# Patient Record
Sex: Female | Born: 1985 | Race: White | Hispanic: No | Marital: Single | Smoking: Current every day smoker
Health system: Southern US, Community
[De-identification: ages and names within clinical notes are randomized; demographics above are authoritative.]

## PROBLEM LIST (undated history)

## (undated) DIAGNOSIS — F988 Other specified behavioral and emotional disorders with onset usually occurring in childhood and adolescence: Secondary | ICD-10-CM

## (undated) DIAGNOSIS — N83209 Unspecified ovarian cyst, unspecified side: Secondary | ICD-10-CM

## (undated) DIAGNOSIS — K589 Irritable bowel syndrome without diarrhea: Secondary | ICD-10-CM

## (undated) DIAGNOSIS — G950 Syringomyelia and syringobulbia: Secondary | ICD-10-CM

## (undated) DIAGNOSIS — I499 Cardiac arrhythmia, unspecified: Secondary | ICD-10-CM

## (undated) DIAGNOSIS — E538 Deficiency of other specified B group vitamins: Secondary | ICD-10-CM

## (undated) DIAGNOSIS — K449 Diaphragmatic hernia without obstruction or gangrene: Secondary | ICD-10-CM

## (undated) DIAGNOSIS — K219 Gastro-esophageal reflux disease without esophagitis: Secondary | ICD-10-CM

## (undated) HISTORY — PX: TOOTH EXTRACTION: SUR596

## (undated) HISTORY — DX: Deficiency of other specified B group vitamins: E53.8

## (undated) HISTORY — DX: Unspecified ovarian cyst, unspecified side: N83.209

## (undated) HISTORY — DX: Diaphragmatic hernia without obstruction or gangrene: K44.9

## (undated) HISTORY — DX: Syringomyelia and syringobulbia: G95.0

## (undated) HISTORY — DX: Gastro-esophageal reflux disease without esophagitis: K21.9

## (undated) HISTORY — DX: Irritable bowel syndrome, unspecified: K58.9

## (undated) HISTORY — DX: Other specified behavioral and emotional disorders with onset usually occurring in childhood and adolescence: F98.8

---
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
## 2003-05-05 ENCOUNTER — Encounter: Admission: RE | Admit: 2003-05-05 | Discharge: 2003-05-05 | Payer: Self-pay | Admitting: Family Medicine

## 2003-10-28 ENCOUNTER — Ambulatory Visit (HOSPITAL_COMMUNITY): Admission: RE | Admit: 2003-10-28 | Discharge: 2003-10-28 | Payer: Self-pay | Admitting: Internal Medicine

## 2004-01-06 ENCOUNTER — Encounter (HOSPITAL_COMMUNITY): Admission: RE | Admit: 2004-01-06 | Discharge: 2004-01-06 | Payer: Self-pay | Admitting: Family Medicine

## 2004-01-12 ENCOUNTER — Ambulatory Visit (HOSPITAL_COMMUNITY): Admission: RE | Admit: 2004-01-12 | Discharge: 2004-01-12 | Payer: Self-pay | Admitting: Family Medicine

## 2005-05-04 ENCOUNTER — Ambulatory Visit (HOSPITAL_COMMUNITY): Admission: RE | Admit: 2005-05-04 | Discharge: 2005-05-04 | Payer: Self-pay | Admitting: Family Medicine

## 2007-03-12 ENCOUNTER — Ambulatory Visit (HOSPITAL_COMMUNITY): Admission: RE | Admit: 2007-03-12 | Discharge: 2007-03-12 | Payer: Self-pay | Admitting: Family Medicine

## 2007-03-21 ENCOUNTER — Emergency Department (HOSPITAL_COMMUNITY): Admission: EM | Admit: 2007-03-21 | Discharge: 2007-03-21 | Payer: Self-pay | Admitting: Emergency Medicine

## 2007-03-22 ENCOUNTER — Ambulatory Visit (HOSPITAL_COMMUNITY): Admission: RE | Admit: 2007-03-22 | Discharge: 2007-03-22 | Payer: Self-pay | Admitting: Emergency Medicine

## 2007-03-28 ENCOUNTER — Ambulatory Visit: Payer: Self-pay | Admitting: Gastroenterology

## 2007-04-09 ENCOUNTER — Ambulatory Visit: Payer: Self-pay | Admitting: Internal Medicine

## 2007-04-09 ENCOUNTER — Ambulatory Visit (HOSPITAL_COMMUNITY): Admission: RE | Admit: 2007-04-09 | Discharge: 2007-04-09 | Payer: Self-pay | Admitting: Internal Medicine

## 2007-04-09 HISTORY — PX: COLONOSCOPY: SHX174

## 2007-08-02 ENCOUNTER — Ambulatory Visit (HOSPITAL_COMMUNITY): Admission: RE | Admit: 2007-08-02 | Discharge: 2007-08-02 | Payer: Self-pay | Admitting: Family Medicine

## 2009-02-24 ENCOUNTER — Ambulatory Visit (HOSPITAL_COMMUNITY): Admission: RE | Admit: 2009-02-24 | Discharge: 2009-02-24 | Payer: Self-pay | Admitting: Family Medicine

## 2009-03-18 ENCOUNTER — Ambulatory Visit (HOSPITAL_COMMUNITY): Admission: RE | Admit: 2009-03-18 | Discharge: 2009-03-18 | Payer: Self-pay | Admitting: Family Medicine

## 2009-03-31 DIAGNOSIS — K589 Irritable bowel syndrome without diarrhea: Secondary | ICD-10-CM | POA: Insufficient documentation

## 2009-03-31 DIAGNOSIS — G43909 Migraine, unspecified, not intractable, without status migrainosus: Secondary | ICD-10-CM | POA: Insufficient documentation

## 2009-03-31 DIAGNOSIS — B029 Zoster without complications: Secondary | ICD-10-CM | POA: Insufficient documentation

## 2009-04-13 ENCOUNTER — Ambulatory Visit: Payer: Self-pay | Admitting: Cardiology

## 2009-04-13 ENCOUNTER — Ambulatory Visit (HOSPITAL_COMMUNITY): Admission: RE | Admit: 2009-04-13 | Discharge: 2009-04-13 | Payer: Self-pay | Admitting: Pulmonary Disease

## 2009-04-13 ENCOUNTER — Encounter (INDEPENDENT_AMBULATORY_CARE_PROVIDER_SITE_OTHER): Payer: Self-pay | Admitting: Pulmonary Disease

## 2009-05-14 ENCOUNTER — Ambulatory Visit: Payer: Self-pay | Admitting: Internal Medicine

## 2009-05-17 DIAGNOSIS — K5900 Constipation, unspecified: Secondary | ICD-10-CM | POA: Insufficient documentation

## 2010-02-21 HISTORY — PX: ESOPHAGOGASTRODUODENOSCOPY: SHX1529

## 2010-03-23 NOTE — Assessment & Plan Note (Signed)
Summary: follow up - cdg   Visit Type:  Follow-up Visit Primary Care Provider:  cresenzo  Chief Complaint:  follow up- having problems with bloating and sob.  History of Present Illness: 25 year old lady with chronic abdominal pain abdominal bloating and worsening of constipation. This lady has been extensively evaluated through this office previously. Prior workup included upper GI  / small bowel follow-through and ngative ileocolonoscopy 2009 (Mother with a history colonic polyps at 64). Does not get the urge to have a bowel movement; may go once weekly has chronic abdominal bloating does not have diarrhea; not having any blood per rectum. She has gained 18 pounds since  February 2009. TSH normal at 1.73 -  March 2009. She was on MiraLax. She did not feel that this helped. She did not followup with [place] previously. In fact, she was a no show on 3.18. 2009.  More recently, she has had problems with dyspnea and dyspnea on exertion. She's been thoroughly evaluated by Dr. Carey Bullocks and [place]. Apparently no significant findings from their evaluation.  Preventive Screening-Counseling & Management  Alcohol-Tobacco     Smoking Status: quit  Current Problems (verified): 1)  Herpes Zoster  (ICD-053.9) 2)  Migraine Headache  (ICD-346.90) 3)  Hx of Irritable Bowel Syndrome  (ICD-564.1)  Current Medications (verified): 1)  Birth Control .... Once Daily  Allergies (verified): 1)  ! Penicillin 2)  ! Imitrex  Past History:  Past Surgical History: none  Family History: Father: alive- htn Mother: alive- fibromialgia Siblings: 1 brother No FH of Colon Cancer:  Social History: Marital Status: no Children: no Occupation: yes, [employer] Patient is a former smoker.  Alcohol Use - no Smoking Status:  quit  Vital Signs:  Patient profile:   25 year old female Height:      65.5 inches Weight:      149 pounds BMI:     24.51 Temp:     98.2 degrees F oral Pulse rate:    60 / minute BP sitting:   122 / 88  (left arm) Cuff size:   regular  Vitals Entered By: Hendricks Limes LPN (May 14, 2009 9:37 AM)  Physical Exam  General:  very pleasant well-groomed 25 year old lady resting comfortably Lungs:  clear to auscultation Heart:  regular rate and rhythm upper gallop rub Abdomen:  umbilicus piercing present. Possibly sounds;  soft minimal diffuse tenderness without appreciable mass or organomegaly  Impression & Recommendations:  Impression 25 year old lady with long-standing chronic abdominal pain in the setting of constipation. She rarely gets urgent have a bowel movement. 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 Likely has secondary abdominal bloating which could be, in part, secondary to the backup of stool in her colon. Her perceived dyspnea may be related to increased intra-abdominal pressure with displacement of the diaphragm to some degree.  Recommendations: A 4 L GoLYTELY purge this weekend. Begin Amitiza 8 micrograms daily during breakfast x5 days then increase to b.i.d. ( during breakfast and supper). Admonished about side effects of headache and nausea. Possible diarrhea also reviewed as a side effect. Warned about pregnancy. She is on birth control pills and denies pregnancy. Samples provided. Also, again digestive advantage for gas bloat /  constipation probiotic. She is to check in with [place] via telephone next week. I told the patient our goal should be at least one bowel movement every other day. Further adjustments in his regimen may be needed. We'll plan to see this nicely  back in one month.  Appended Document: Orders Update    Clinical Lists Changes  Problems: Added new problem of CONSTIPATION (ICD-564.00) Orders: Added new Service order of Est. Patient Level IV (43154) - Signed

## 2010-03-29 ENCOUNTER — Ambulatory Visit (INDEPENDENT_AMBULATORY_CARE_PROVIDER_SITE_OTHER): Payer: Self-pay | Admitting: Gastroenterology

## 2010-03-29 ENCOUNTER — Other Ambulatory Visit: Payer: Self-pay | Admitting: Internal Medicine

## 2010-03-29 ENCOUNTER — Encounter: Payer: Self-pay | Admitting: Internal Medicine

## 2010-03-29 ENCOUNTER — Encounter: Payer: Self-pay | Admitting: Gastroenterology

## 2010-03-29 DIAGNOSIS — R109 Unspecified abdominal pain: Secondary | ICD-10-CM

## 2010-03-29 DIAGNOSIS — R110 Nausea: Secondary | ICD-10-CM | POA: Insufficient documentation

## 2010-03-29 DIAGNOSIS — K5900 Constipation, unspecified: Secondary | ICD-10-CM

## 2010-03-30 ENCOUNTER — Other Ambulatory Visit: Payer: Self-pay | Admitting: Internal Medicine

## 2010-03-30 DIAGNOSIS — R110 Nausea: Secondary | ICD-10-CM

## 2010-04-02 ENCOUNTER — Other Ambulatory Visit (HOSPITAL_COMMUNITY): Payer: BC Managed Care – PPO

## 2010-04-08 NOTE — Assessment & Plan Note (Signed)
Summary: ABD PAIN/CANT EAT,WHEN SHE DOES SHE GETS SICK/LAW   Vital Signs:  Patient profile:   25 year old female Height:      65.5 inches Weight:      144 pounds BMI:     23.68 Temp:     98.5 degrees F oral Pulse rate:   72 / minute BP sitting:   108 / 70  (left arm) Cuff size:   regular  Vitals Entered By: Hendricks Limes LPN (March 29, 2010 2:48 PM)  Visit Type:  Follow-up Visit Primary Care Provider:  cresenzo  CC:  abd pain and abd swelling.  History of Present Illness: Karen Cantu is a pleasant 25 year old Caucasian female who presents today in f/u for hx of constipation. She was started on Amitiza at last office visit, march 2011. She states she is doing well now without any constipation. No melena or hematochezia. Main concern is of upper abdominal/epigastric pain that occurs only with meals. States has been going on for 7 years. Described as "contractions", "rolling". Feels like she is going to die. +nausea with pain. Sometimes cold and sweaty. Has to break meals up during the day due to severity of pain. Denies dysphagia. Does have hx of reflux which is controlled by once daily Protonix. Takes BC powder sparingly for headache. No prior endoscopy. Has had several [place] of abdomen in past that were essentially normal,last in January 2009. Mac n cheese precipatates pain, no problem with protein bars, which is what she mainly eats.   Current Medications (verified): 1)  Birth Control .... Once Daily 2)  Protonix 40 Mg Tbec (Pantoprazole Sodium) .... Two Times A Day 3)  Wellbutrin 75 Mg Tabs (Bupropion Hcl) .... At Bedtime  Allergies (verified): 1)  ! Penicillin 2)  ! Imitrex  Past History:  Past Medical History: IBS-C hx Migraines, stable now TCS 2009: normal  Past Surgical History: Reviewed history from 05/14/2009 and no changes required. none  Family History: Reviewed history from 05/14/2009 and no changes required. Father: alive- htn Mother: alive-  fibromialgia Siblings: 1 brother No FH of Colon Cancer:  Social History: Marital Status: no Children: no Occupation: yes, [employer] Patient is a former smoker.  Alcohol Use - socially  Review of Systems General:  Denies fever, chills, and anorexia. Eyes:  Denies blurring, irritation, and discharge. ENT:  Denies sore throat, hoarseness, and difficulty swallowing. CV:  Denies chest pains and syncope. Resp:  Denies dyspnea at rest and wheezing. GI:  Complains of nausea and abdominal pain; denies difficulty swallowing, pain on swallowing, constipation, change in bowel habits, bloody BM's, and black BMs. GU:  Denies urinary burning and urinary frequency. MS:  Denies joint pain / LOM, joint swelling, and joint stiffness. Derm:  Denies rash, itching, and dry skin. Neuro:  Denies weakness and syncope. Psych:  Denies depression and anxiety. Endo:  Denies cold intolerance and heat intolerance. Heme:  Denies bruising and bleeding.  Physical Exam  General:  Well developed, well nourished, no acute distress. Head:  Normocephalic and atraumatic. Eyes:  sclera without icterus Lungs:  Clear throughout to auscultation. Heart:  Regular rate and rhythm; no murmurs, rubs,  or bruits. Abdomen:  +BS, soft, non-tender, non-distended. no HSM,. no rebound or guarding.  Msk:  Symmetrical with no gross deformities. Normal posture. Extremities:  No clubbing, cyanosis, edema or deformities noted. Neurologic:  Alert and  oriented x4;  grossly normal neurologically. Skin:  Intact without significant lesions or rashes. Psych:  Alert and cooperative. Normal  mood and affect.   Impression & Recommendations:  Problem # 1:  ABDOMINAL PAIN, UNSPECIFIED SITE (ICD-54.82)  25 year old Caucasian female with reported 7 year hx of upper abdominal pain, described as "rolling" and like labor pains with eating. +nausea. Intermittent in nature. Worsened by foods such as mac n cheese. No difficulty with  protein bars. No dysphagia/odynophagia. Hx of reflux but controlled on once daily protonix. Hx of normal [place] in Jan 2009. No prior endoscopy. Although no stones noted on [place], question component of biliary dyskinesia vs gastritis/PUD.   Assess with HIDA scan first EGD most likely to follow: R/B/A have been discussed in detail; pt states understanding and desires to proceed.   Orders: Est. Patient Level II (16109)  Problem # 2:  NAUSEA (ICD-787.02)  likely component of # 1.   Orders: Est. Patient Level II (60454)  Problem # 3:  Hx of IRRITABLE BOWEL SYNDROME (ICD-564.1) constipation dominant, doing well currently. No changes.    Orders Added: 1)  Est. Patient Level II [09811]

## 2010-04-08 NOTE — Letter (Signed)
Summary: GES ORDER  GES ORDER   Imported By: Ave Filter 03/29/2010 15:51:39  _____________________________________________________________________  External Attachment:    Type:   Image     Comment:   External Document  Appended Document: GES ORDER Refaxed order for pt to have a hida scan and not ges order.

## 2010-04-08 NOTE — Letter (Signed)
Summary: EGD ORDER  EGD ORDER   Imported By: Ave Filter 03/29/2010 16:00:11  _____________________________________________________________________  External Attachment:    Type:   Image     Comment:   External Document

## 2010-04-08 NOTE — Letter (Signed)
Summary: HIDA SCAN ORDER  HIDA SCAN ORDER   Imported By: Ave Filter 03/29/2010 16:49:52  _____________________________________________________________________  External Attachment:    Type:   Image     Comment:   External Document

## 2010-04-12 ENCOUNTER — Encounter (HOSPITAL_COMMUNITY)
Admission: RE | Admit: 2010-04-12 | Discharge: 2010-04-12 | Disposition: A | Discharge status: Home / Self Care | Payer: BC Managed Care – PPO | Source: Ambulatory Visit | Attending: Internal Medicine | Admitting: Internal Medicine

## 2010-04-12 ENCOUNTER — Encounter (HOSPITAL_COMMUNITY): Payer: Self-pay

## 2010-04-12 DIAGNOSIS — R110 Nausea: Secondary | ICD-10-CM

## 2010-04-12 DIAGNOSIS — R109 Unspecified abdominal pain: Secondary | ICD-10-CM | POA: Insufficient documentation

## 2010-04-12 MED ORDER — TECHNETIUM TC 99M MEBROFENIN IV KIT
5.0000 | PACK | Freq: Once | INTRAVENOUS | Status: AC | PRN
  Administered 2010-04-12: 4.5 via INTRAVENOUS

## 2010-04-15 ENCOUNTER — Encounter: Payer: BC Managed Care – PPO | Admitting: Internal Medicine

## 2010-04-15 ENCOUNTER — Ambulatory Visit (HOSPITAL_COMMUNITY)
Admission: RE | Admit: 2010-04-15 | Discharge: 2010-04-15 | Disposition: A | Discharge status: Home / Self Care | Payer: BC Managed Care – PPO | Source: Ambulatory Visit | Attending: Internal Medicine | Admitting: Internal Medicine

## 2010-04-15 ENCOUNTER — Telehealth (INDEPENDENT_AMBULATORY_CARE_PROVIDER_SITE_OTHER): Payer: Self-pay | Admitting: *Deleted

## 2010-04-15 DIAGNOSIS — R109 Unspecified abdominal pain: Secondary | ICD-10-CM | POA: Insufficient documentation

## 2010-04-15 DIAGNOSIS — K449 Diaphragmatic hernia without obstruction or gangrene: Secondary | ICD-10-CM

## 2010-04-15 DIAGNOSIS — K5909 Other constipation: Secondary | ICD-10-CM | POA: Insufficient documentation

## 2010-04-16 NOTE — Op Note (Signed)
  Karen Cantu, Karen Cantu            ACCOUNT NO.:  [id number]  MEDICAL RECORD NO.:  [id number]           PATIENT TYPE:  O  LOCATION:  DAYP                          FACILITY:  [facility]  PHYSICIAN:  R. Roetta Sessions, M.D. DATE OF BIRTH:  [birth year]  DATE OF PROCEDURE:  04/15/2010 DATE OF DISCHARGE:                              OPERATIVE REPORT   INDICATIONS FOR PROCEDURE:  A 25 year old lady with at least 7-year history of postprandial upper abdominal pain without change in bowel habits, although this is in the setting of longstanding chronic constipation, some improvement with Amitiza 8 mcg daily (forgets to take the p.m. dose) and occasional MiraLax, has bowel movements on the order of 2 times weekly.  HIDA recently demonstrated gallbladder EF 44% with CCK and no reproduction of symptoms.  EGD is now being done to further evaluate symptoms.  Risks, benefits, limitations, alternatives, imponderables have been reviewed, questions answered.  Please see the documentation in the medical record.  PROCEDURE NOTE:  O2 saturation, blood pressure, pulse, respirations monitored throughout the entirety of the procedure.  CONSCIOUS SEDATION:  Versed and Demerol in incremental doses.  Cetacaine spray for topical pharyngeal anesthesia.  INSTRUMENT:  Pentax video chip system.  FINDINGS:  Examination of the tubular esophagus revealed no mucosal abnormalities.  EG junction easily traversed.  Stomach:  Gastric cavity was emptied and insufflated well with air.  Thorough examination of gastric mucosa including retroflexion of proximal stomach and esophagogastric junction demonstrated only a small hiatal hernia. Pylorus was patent, easily traversed.  Examination of the bulb, second portion revealed no abnormalities.  THERAPEUTIC/DIAGNOSTIC MANEUVERS PERFORMED:  None.  The patient tolerated the procedure well and was reactive to the Endoscopy.  IMPRESSION:  Normal esophagus, small hiatal hernia;  otherwise, normal stomach, D1, and D2.  RECOMMENDATIONS:  Increase Amitiza to 16 mcg each morning during breakfast and take an 8 mcg dose in the evening during supper.  Warned about side effects of nausea, headache, and diarrhea.  I have emphasized compliance on this regimen.  We would like to see this nice lady have a bowel movement daily or every other day. It may well be that her postprandial abdominal pain will improve if she evacuates her colon on a more regular basis.  Findings on today's exam and her gallbladder workup thus far are reassuring.  I have also asked her to keep a stool diary.  Plan to see this nice lady back in the office in 3 months for reassessment.     Karen Cantu, M.D.     RMR/MEDQ  D:  04/15/2010  T:  04/15/2010  Job:  [id number]  cc:   [facility]  Electronically Signed by Lorrin Goodell M.D. on 04/16/2010 03:51:25 PM

## 2010-04-20 NOTE — Progress Notes (Signed)
  Phone Note Call from Patient   Summary of Call: Pts mom stating RMR prescriped Amitiza for [facility] after her procedure today but did not give her a Rx- they would like for one to be [facility] into [facility] [place].  Initial call taken by: Peggyann Shoals,  April 15, 2010 1:26 PM     Appended Document: amitiza    Prescriptions: AMITIZA 8 MCG CAPS (LUBIPROSTONE) 2 by mouth qam & 1 by mouth qpm as needed diarrhea  #90 x 2   Entered and Authorized by:   Joselyn Arrow FNP-BC   Signed by:   Joselyn Arrow FNP-BC on 04/16/2010   Method used:   [facility]ronically to        [phone]* (retail)       [address]       [place], [place]  [address]       Ph: [phone] or [phone]       Fax: [phone]   RxID:   [id number]

## 2010-04-22 ENCOUNTER — Telehealth (INDEPENDENT_AMBULATORY_CARE_PROVIDER_SITE_OTHER): Payer: Self-pay

## 2010-04-29 NOTE — Progress Notes (Signed)
Summary: cant afford amitiza  Phone Note Call from Patient Call back at [facility] Phone [phone] Call back at Work Phone [phone]   Caller: Patient Summary of Call: pt called- left voicemail- amitiza rx is 140.00 and pt cannot afford it. wants to know if there is anything else she can take. please advise Initial call taken by: Hendricks Limes LPN,  April 22, 2010 10:45 AM     Appended Document: cant afford amitiza next best approach would be miralax 1,2 or even 3 times daily titratd to one bm daily to every other day  Appended Document: cant afford amitiza tried to call pt- she went to lunch  Appended Document: cant afford amitiza pt aware

## 2010-07-06 NOTE — Consult Note (Signed)
NAMEVICKIE, Karen Cantu            ACCOUNT NO.:  [id number]   MEDICAL RECORD NO.:  [id number]          PATIENT TYPE:  AMB   LOCATION:  DAY                           FACILITY:  [facility]   PHYSICIAN:  Kassie Mends, M.D.      DATE OF BIRTH:  August 18, 1985   DATE OF CONSULTATION:  DATE OF DISCHARGE:                                 CONSULTATION   REQUESTING PHYSICIAN:  Dr. Nobie Putnam.   PRIMARY GASTROENTEROLOGIST:  Dr. Jena Gauss.   CHIEF COMPLAINT:  Ms. Rhines is a 25 year old female.  She has  previously been evaluated in our office by Dr. Jena Gauss for history of IBS.  She admits to not returning for followup.  She has history of chronic  abdominal pain; however, her symptoms have worsened recently.  She began  to have significant urgency to feel the need to defecate; when she does,  she feels as though she cannot go.  She has spasms that radiate for her  rectum up into her stomach.  She has generalized abdominal pain; the  pain is not always associated with a bowel movement, however.  She  cannot make an association with certain foods either.  She denies any  history of diarrhea.  Her stools are mostly hard.  She generally has a  bowel movement every 2-3 days.  She denies any rectal bleed bleeding or  melena.  She does have some nausea with the pain.  She has noticed that  she occasionally gags while eating, denies any dysphagia or odynophagia.  She has noticed abdominal bloating.  Her weight is steadily increasing.  Pain does seem to radiate at times around to her right side and to her  back.  She has had workup thus far which has included a CBC, which was  normal, and negative serum H. pylori.  She had an normal upper GI series  on March 12, 2007.  She had a normal amylase and lipase on March 27, 2007.  She had an abdominal ultrasound on March 22, 2007 which was  normal.  She has had a urine pregnancy test which was normal.  She is on  Alesse birth control pills.   PAST MEDICAL AND  SURGICAL HISTORY:  1. She is currently being treated for herpes zoster, although she has      not had a rash.  2. Migraine headaches.   CURRENT MEDICATIONS:  1. Alesse once daily.  2. Topamax, unknown dose daily.  3. Gabapentin 100 mg b.i.d.  4. Famciclovir 500 mg t.i.d.   ALLERGIES:  PENICILLIN and IMITREX.   FAMILY HISTORY:  Positive for mother with colon polyps at age 21; mother  also has benign uterine tumors and fibromyalgia.  Father age 27 and is  healthy.  She has 1 brother with severe anxiety.   SOCIAL HISTORY:  Ms. Yarbro is single.  She lives with her mom.  She  is employed at [employer].  She has a 5-pack-year history of  tobacco use, currently smokes about half a pack a day, rarely consumes  alcohol and denies any drug use.   REVIEW OF SYSTEMS:  See HPI, otherwise negative.   PHYSICAL EXAM:  VITAL SIGNS:  Weight 131 pounds, height 54-1/2 inches,  temperature 98.2, blood pressure 100/70 and pulse 80.  GENERAL:  She is a well-developed, well-nourished Caucasian female in no  acute distress.  HEENT.  Pupils equal, anicteric sclerae.  Conjunctivae pink.  Oropharynx  pink and moist without any lesions.  NECK:  Supple.  She does have bilateral thyroid soft tissue enlargement,  possible goiter, that is nontender.  CHEST:  Heart regular rate and rhythm, normal S1-S2 without any murmurs,  clicks, rubs or gallops.  Lungs clear to auscultation bilaterally.  ABDOMEN:  Positive bowel sounds x4.  No bruits auscultated.  Soft,  nontender and non-distended without palpable mass or hepatosplenomegaly.  No rebound tenderness or guarding.  RECTAL:  No external lesions visualized.  Good sphincter tone.  Internal  exam reveals just beyond of my finger there is a palpable fullness  anteriorly, unable to assess completely.  A small amount of medium brown  stool was obtained for the vault which was Hemoccult-positive.   IMPRESSION:  Ms. Cutler is a 25 year old female with  longstanding  history of chronic abdominal pain and previously felt had irritable  bowel syndrome, although she admits to not following up with her workup  here at our office approximately 3 years ago.  Today, she describes  chronic abdominal pain along with tenesmus and chronic  constipation/obstipation.  She is found to be Hemoccult-positive and  rectal exam shows an irregularity anteriorly, just beyond reach.  She is  going to require further evaluation with colonoscopy to determine  etiology of her Hemoccult-positive stool, abdominal pain and further  evaluate findings on rectal exam.  We need to rule out colorectal  carcinoma in Ms. Balthazor at this time, although it is possible she  could have benign anorectal source for Hemoccult-positive stool  including occult fissure or hemorrhoids, along with irritable bowel  syndrome.   PLAN:  1. Check TSH.  2. Begin MiraLax 17 g daily.  3. Begin Benefiber daily.  4. Colonoscopy with Dr. Jena Gauss in the near future.  I discussed this      procedure including risks and benefits, which include, but not      limited to, bleeding, infection, perforation and drug reaction.      She agrees and informed consent will be obtained.      Lorenza Burton, N.P.      Kassie Mends, M.D.  Electronically Signed    KJ/MEDQ  D:  03/28/2007  T:  03/29/2007  Job:  [id number]   cc:   Patrica Duel, M.D.  Fax: [phone]

## 2010-07-06 NOTE — Op Note (Signed)
NAMEELINE, GENG            ACCOUNT NO.:  [id number]   MEDICAL RECORD NO.:  [id number]          PATIENT TYPE:  AMB   LOCATION:  DAY                           FACILITY:  [facility]   PHYSICIAN:  R. Roetta Sessions, M.D. DATE OF BIRTH:  Sep 15, 1985   DATE OF PROCEDURE:  04/09/2007  DATE OF DISCHARGE:                               OPERATIVE REPORT   PROCEDURE:  Ileocolonoscopy, diagnostic.   INDICATIONS FOR PROCEDURE:  A 25 year old lady with history of chronic  abdominal pain felt to be in part related to irritable bowel syndrome,  chronic obstipation, constipation, Hemoccult positive on digital rectal  exam, question of irregularity anteriorly on DRE in the office.  Because  of this finding and Hemoccult positive status and in view of her  symptoms, colonoscopy is now being performed. Risks, benefits and  alternatives have been reviewed, questions answered.  She is agreeable.  Please see documentation on the medical record.   PROCEDURE NOTE:  O2 saturation, blood pressure, pulse and respirations  were monitored throughout the entirety of the procedure.   CONSCIOUS SEDATION:  Versed 7 mg IV, Demerol 175 mg IV, Phenergan 12.5  mg diluted slow IV push to augment conscious sedation.   INSTRUMENT:  Pentax video chip adult and pediatric colonoscope.   FINDINGS:  External inspection demonstrated no abnormalities.  Digital  rectal examination by me demonstrated no abnormalities.   ENDOSCOPIC FINDINGS:  The prep was excellent.  Utilizing the adult  colonoscope, I advanced through the rectum and gradually acute turn in  the sigmoid for which I elected to go ahead and just pull back and get  the pediatric colonoscopes as this lady is relatively small framed.  This was done without difficulty and I was able to advance through the  sigmoid and across the left, transverse and right colon to the area of  appendiceal orifice, ileocecal valve and cecum without difficulty.  These structures were  well seen and photographed for the record.  Terminal ileum was intubated to 5 cm.  From this level, the scope was  slowly withdrawn.  All previously mentioned mucosal surfaces were again  seen.  The colonic mucosa appeared entirely normal as did terminal ileal  mucosa.  Scope was pulled out of the rectum.  Rectal vault was small.  I  attempted to retroflex the pediatric scope and was unable to do so, but  for the same reason I gained very good inspection of the rectal mucosa  on __________  and it appeared normal.  The patient tolerated the  procedure well as reactive to endoscopy.   IMPRESSION:  Normal rectum, colon, terminal ileum.   At this point, would not pursue further GI evaluation unless the patient  was having ongoing signs and symptoms of GI bleeding.  She tells me  MiraLax prescribed at the office really has not done much good, but I do  know that she had an excellent colon prep today.   RECOMMENDATIONS:  1. Continue MiraLax 70 grams orally at bedtime daily p.r.n.      constipation.  2. Daily Benefiber 1 tablespoon daily as scheduled agent.  3.  Keep stool diary.  4. Follow-up appointment with [place] in one month so we can assess her      progress.      Jonathon Bellows, M.D.  Electronically Signed     RMR/MEDQ  D:  04/09/2007  T:  04/10/2007  Job:  [id number]   cc:   Patrica Duel, M.D.  Fax: [phone]

## 2010-07-15 ENCOUNTER — Ambulatory Visit: Payer: BC Managed Care – PPO | Admitting: Gastroenterology

## 2010-11-12 LAB — COMPREHENSIVE METABOLIC PANEL
ALT: 11
Alkaline Phosphatase: 37 — ABNORMAL LOW
BUN: 7
CO2: 26
Calcium: 9
Chloride: 107
Creatinine, Ser: 0.68
GFR calc Af Amer: >60
Glucose, Bld: 145 — ABNORMAL HIGH
Total Bilirubin: 0.6

## 2010-11-12 LAB — URINALYSIS, ROUTINE W REFLEX MICROSCOPIC
Specific Gravity, Urine: 1.025
pH: 6

## 2010-11-12 LAB — CBC
HCT: 37
MCHC: 35.8
MCV: 87.7
RBC: 4.22
WBC: 9.4

## 2010-11-12 LAB — DIFFERENTIAL
Basophils Relative: 1
Eosinophils Absolute: 0.1
Eosinophils Relative: 1
Monocytes Absolute: 0.5
Monocytes Relative: 6

## 2012-05-30 ENCOUNTER — Ambulatory Visit (INDEPENDENT_AMBULATORY_CARE_PROVIDER_SITE_OTHER): Payer: BC Managed Care – PPO | Admitting: *Deleted

## 2012-05-30 DIAGNOSIS — Z3049 Encounter for surveillance of other contraceptives: Secondary | ICD-10-CM

## 2012-05-30 DIAGNOSIS — Z309 Encounter for contraceptive management, unspecified: Secondary | ICD-10-CM

## 2012-05-30 MED ORDER — MEDROXYPROGESTERONE ACETATE 150 MG/ML IM SUSP
150.0000 mg | Freq: Once | INTRAMUSCULAR | Status: AC
  Administered 2012-05-30: 150 mg via INTRAMUSCULAR

## 2012-05-30 NOTE — Patient Instructions (Addendum)

## 2012-05-30 NOTE — Progress Notes (Signed)
Pt tolerated well

## 2012-06-12 ENCOUNTER — Telehealth: Payer: Self-pay | Admitting: Nurse Practitioner

## 2012-06-13 NOTE — Telephone Encounter (Signed)
Patient only tested for STD in PAP. Have to do blood work for HIV testing

## 2012-06-13 NOTE — Telephone Encounter (Signed)
Pt aware.

## 2012-06-13 NOTE — Telephone Encounter (Signed)
That is usually included in STD panel of blood work. Need to pull chart.

## 2012-06-13 NOTE — Telephone Encounter (Signed)
Please advise. Chart will be on desk

## 2012-06-13 NOTE — Telephone Encounter (Signed)
CHART IS ON YOUR SHELF

## 2012-06-28 ENCOUNTER — Encounter: Payer: Self-pay | Admitting: Nurse Practitioner

## 2012-06-28 ENCOUNTER — Ambulatory Visit (INDEPENDENT_AMBULATORY_CARE_PROVIDER_SITE_OTHER): Payer: BC Managed Care – PPO | Admitting: Nurse Practitioner

## 2012-06-28 VITALS — BP 115/78 | HR 86 | Temp 100.3°F | Ht 65.0 in | Wt 133.0 lb

## 2012-06-28 DIAGNOSIS — Z7251 High risk heterosexual behavior: Secondary | ICD-10-CM

## 2012-06-28 LAB — STD PANEL
HIV: NONREACTIVE
Hepatitis B Surface Ag: NEGATIVE

## 2012-06-28 MED ORDER — ESCITALOPRAM OXALATE 10 MG PO TABS: 10.0000 mg | ORAL_TABLET | Freq: Every day | ORAL | Status: DC

## 2012-06-28 NOTE — Patient Instructions (Signed)

## 2012-06-28 NOTE — Progress Notes (Signed)
  Subjective:    Patient ID: Karen Cantu, female    DOB: June 18, 1985, 27 y.o.   MRN: [id number]  HPI-1. Patient had unprotected sex 2-3 weeks ago with her longtime partner- He cheated on her and she is worried about having something.. She denies any vaginal discharge. 2. Family wanted her to come i because they think she is depressed. Patient says she never feels good. Doesn't feel like going anywhere or doing anything. Having trouble remembering things. Can't focus and concentrate. Feels sad and is tearful all the time. Panic attacks that cause her to be light headed, shakey and heart racing. Feels like she is useless.Denies suicidal thoughts. Patient on vyvanse and she thinks it is to strong.   Review of Systems Covered in HPI     Objective:   Physical Exam  Constitutional: She appears well-developed and well-nourished.  Cardiovascular: Normal rate and normal heart sounds.   Pulmonary/Chest: Effort normal and breath sounds normal.  Genitourinary:  No exam done today  Psychiatric: She has a normal mood and affect. Judgment and thought content normal.  Tearful throughout appointment Good eye contact Answers all questions appropriately.   BP 115/78  Pulse 86  Temp(Src) 100.3 F (37.9 C) (Oral)  Ht 5\' 5"  (1.651 m)  Wt 133 lb (60.328 kg)  BMI 22.13 kg/m2        Assessment & Plan:  1. High risk sexual behavior Safe sex discussed - GC/chlamydia probe amp, urine - STD Panel (HBSAG,HIV,RPR)  2. Depression Lexapro 10 mg 1 PO QD #30 2 refills Side effects discussed Stress management Exercise daily  Mary-Margaret Daphine Deutscher, FNP

## 2012-06-29 LAB — GC/CHLAMYDIA PROBE AMP, URINE: GC Probe Amp, Urine: NEGATIVE

## 2012-07-02 ENCOUNTER — Telehealth: Payer: Self-pay | Admitting: Nurse Practitioner

## 2012-07-02 NOTE — Telephone Encounter (Signed)
Patient aware of results.

## 2012-07-02 NOTE — Telephone Encounter (Signed)
Patient would like results of lab test done 5/8

## 2012-07-02 NOTE — Telephone Encounter (Signed)
All labs were negative.

## 2012-07-04 ENCOUNTER — Other Ambulatory Visit: Payer: Self-pay | Admitting: Nurse Practitioner

## 2012-07-04 ENCOUNTER — Telehealth: Payer: Self-pay | Admitting: Nurse Practitioner

## 2012-07-04 MED ORDER — SERTRALINE HCL 50 MG PO TABS: 50.0000 mg | ORAL_TABLET | Freq: Every day | ORAL | Status: DC

## 2012-07-04 NOTE — Telephone Encounter (Signed)
Stop Lexapro. Change to zoloft- Will send rx to pharmacy

## 2012-07-04 NOTE — Telephone Encounter (Signed)
Please advise 

## 2012-07-04 NOTE — Telephone Encounter (Signed)
Pt aware of change in antidepressant

## 2012-07-17 ENCOUNTER — Telehealth: Payer: Self-pay | Admitting: Nurse Practitioner

## 2012-07-17 NOTE — Telephone Encounter (Signed)
Spoke with patient.

## 2012-07-19 ENCOUNTER — Ambulatory Visit: Payer: BC Managed Care – PPO | Admitting: Nurse Practitioner

## 2012-08-03 ENCOUNTER — Telehealth: Payer: Self-pay

## 2012-08-03 NOTE — Telephone Encounter (Signed)
Agree 

## 2012-08-03 NOTE — Telephone Encounter (Signed)
I talked with Karen Cantu and she advise for the patient to go on to there ER since she was having black tarrty stools with blood in it. Pt will be going to to the ER.

## 2012-08-03 NOTE — Telephone Encounter (Signed)
Pt called wanting an appointment. She is having black tary stool with blood in it. She is also having abd pain. I made her an appointment of Monday at 1:15 but I told her if the pain get worst, fever, and if she is having (more then normal) black stools with blood to go to the ER.

## 2012-08-06 ENCOUNTER — Ambulatory Visit: Payer: BC Managed Care – PPO | Admitting: Gastroenterology

## 2012-08-14 ENCOUNTER — Other Ambulatory Visit: Payer: Self-pay | Admitting: Nurse Practitioner

## 2012-08-14 NOTE — Telephone Encounter (Signed)
rx sent to pharmacy

## 2012-08-15 ENCOUNTER — Ambulatory Visit (INDEPENDENT_AMBULATORY_CARE_PROVIDER_SITE_OTHER): Payer: BC Managed Care – PPO | Admitting: *Deleted

## 2012-08-15 DIAGNOSIS — Z309 Encounter for contraceptive management, unspecified: Secondary | ICD-10-CM

## 2012-08-15 MED ORDER — MEDROXYPROGESTERONE ACETATE 150 MG/ML IM SUSP
150.0000 mg | Freq: Once | INTRAMUSCULAR | Status: AC
  Administered 2012-08-15: 150 mg via INTRAMUSCULAR

## 2012-08-15 NOTE — Patient Instructions (Addendum)

## 2012-08-15 NOTE — Progress Notes (Signed)
Patient tolerated well.

## 2012-08-16 ENCOUNTER — Ambulatory Visit: Payer: BC Managed Care – PPO

## 2012-09-24 ENCOUNTER — Telehealth: Payer: Self-pay | Admitting: Nurse Practitioner

## 2012-09-24 NOTE — Telephone Encounter (Signed)
appt made

## 2012-09-25 ENCOUNTER — Ambulatory Visit (INDEPENDENT_AMBULATORY_CARE_PROVIDER_SITE_OTHER): Payer: BC Managed Care – PPO | Admitting: Nurse Practitioner

## 2012-09-25 ENCOUNTER — Encounter: Payer: Self-pay | Admitting: Nurse Practitioner

## 2012-09-25 ENCOUNTER — Telehealth: Payer: Self-pay | Admitting: General Practice

## 2012-09-25 VITALS — BP 117/84 | HR 82 | Temp 98.5°F | Wt 130.0 lb

## 2012-09-25 DIAGNOSIS — F988 Other specified behavioral and emotional disorders with onset usually occurring in childhood and adolescence: Secondary | ICD-10-CM

## 2012-09-25 MED ORDER — LISDEXAMFETAMINE DIMESYLATE 20 MG PO CAPS: 20.0000 mg | ORAL_CAPSULE | ORAL | Status: DC

## 2012-09-25 NOTE — Telephone Encounter (Signed)
Reviewed pts chart-we have not seen pt in 2 years. she was seen by her pcp this morning and didn't mention any of these problems. Pt was having the same issues in 07/2012 and was advised to go to ED by [facility]. No records of her going to [facility]. Spoke with pt- she feels like she is getting worse, and is going to leave work. Advised pt she needed to go to ED to be evaluated and got Darl Pikes to make her an ov appointment with [place]. (10/18/12)

## 2012-09-25 NOTE — Telephone Encounter (Signed)
Agree 

## 2012-09-25 NOTE — Telephone Encounter (Signed)
Patient is passing black tary stools.  She is also having very bad pack pain right after meal a lot of nausea all day. No fevers or chills.   Please advise?

## 2012-09-25 NOTE — Progress Notes (Signed)
  Subjective:    Patient ID: Karen Cantu, female    DOB: 1985-05-09, 27 y.o.   MRN: [id number]  HPI Pt was seen in office in June for depression and feeling of being overwhelmed because work and personal relationship. Pt was put on lexapro but did not like "the way it made me feel". Then she tried zoloft 50 mg and did not like it either. States that both made her feel emotionless. Pt has stopped medications. Denies any s/s of depression or anxiety.   Before pt was taking Vayvanse 30mg . Pt states this worked well for her and would like to try it again. Pt states she would like to be started on a lower dose. States at times it made her feel dizzy and light headedness.     Review of Systems  All other systems reviewed and are negative.       Objective:   Physical Exam  Constitutional: She is oriented to person, place, and time. She appears well-developed and well-nourished.  Neck: Normal range of motion. Neck supple. No thyromegaly present.  Cardiovascular: Normal rate, regular rhythm, normal heart sounds and intact distal pulses.   Pulmonary/Chest: Effort normal and breath sounds normal.  Abdominal: Soft. Bowel sounds are normal.  Musculoskeletal: Normal range of motion.  Neurological: She is alert and oriented to person, place, and time.  Skin: Skin is warm and dry.  Psychiatric: She has a normal mood and affect. Her behavior is normal. Judgment and thought content normal.     BP 117/84  Pulse 82  Temp(Src) 98.5 F (36.9 C) (Oral)  Wt 130 lb (58.968 kg)  BMI 21.63 kg/m2      Assessment & Plan:  1. ADD (attention deficit disorder) stress management-  lisdexamfetamine (VYVANSE) 20 MG capsule; Take 1 capsule (20 mg total) by mouth every morning.  Dispense: 30 capsule; Refill: 0 - lisdexamfetamine (VYVANSE) 20 MG capsule; Take 1 capsule (20 mg total) by mouth every morning.  Dispense: 30 capsule; Refill: 0 Follow up in 3 months  .mmms

## 2012-09-25 NOTE — Patient Instructions (Addendum)

## 2012-10-03 ENCOUNTER — Telehealth: Payer: Self-pay | Admitting: Nurse Practitioner

## 2012-10-04 MED ORDER — METHYLPHENIDATE HCL ER (OSM) 27 MG PO TBCR: 27.0000 mg | EXTENDED_RELEASE_TABLET | ORAL | Status: DC

## 2012-10-04 NOTE — Telephone Encounter (Signed)
Patient aware.

## 2012-10-04 NOTE — Telephone Encounter (Signed)
rx for concerta here for pick up- Make sure take whole an don an empty stomch

## 2012-10-05 DIAGNOSIS — G950 Syringomyelia and syringobulbia: Secondary | ICD-10-CM | POA: Insufficient documentation

## 2012-10-17 ENCOUNTER — Encounter: Payer: Self-pay | Admitting: Internal Medicine

## 2012-10-18 ENCOUNTER — Telehealth: Payer: Self-pay | Admitting: Gastroenterology

## 2012-10-18 ENCOUNTER — Ambulatory Visit: Payer: BC Managed Care – PPO | Admitting: Gastroenterology

## 2012-10-18 NOTE — Telephone Encounter (Signed)
Please send letter for f/u.  

## 2012-10-18 NOTE — Telephone Encounter (Signed)
Pt was a no show

## 2012-10-25 ENCOUNTER — Encounter: Payer: Self-pay | Admitting: Gastroenterology

## 2012-10-25 NOTE — Telephone Encounter (Signed)
Mailed letter °

## 2012-10-26 ENCOUNTER — Telehealth: Payer: Self-pay | Admitting: Nurse Practitioner

## 2012-10-26 NOTE — Telephone Encounter (Signed)
Patient aware to come in from 9/10-9/24

## 2012-10-30 ENCOUNTER — Ambulatory Visit (INDEPENDENT_AMBULATORY_CARE_PROVIDER_SITE_OTHER): Payer: BC Managed Care – PPO | Admitting: *Deleted

## 2012-10-30 DIAGNOSIS — IMO0001 Reserved for inherently not codable concepts without codable children: Secondary | ICD-10-CM

## 2012-10-30 DIAGNOSIS — Z309 Encounter for contraceptive management, unspecified: Secondary | ICD-10-CM

## 2012-10-30 MED ORDER — MEDROXYPROGESTERONE ACETATE 150 MG/ML IM SUSP
150.0000 mg | Freq: Once | INTRAMUSCULAR | Status: AC
  Administered 2012-10-30: 150 mg via INTRAMUSCULAR

## 2012-12-24 ENCOUNTER — Ambulatory Visit
Admission: RE | Admit: 2012-12-24 | Discharge: 2012-12-24 | Disposition: A | Discharge status: Home / Self Care | Payer: BC Managed Care – PPO | Source: Ambulatory Visit | Attending: Rheumatology | Admitting: Rheumatology

## 2012-12-24 ENCOUNTER — Encounter: Payer: Self-pay | Admitting: General Practice

## 2012-12-24 ENCOUNTER — Ambulatory Visit (INDEPENDENT_AMBULATORY_CARE_PROVIDER_SITE_OTHER): Payer: BC Managed Care – PPO | Admitting: General Practice

## 2012-12-24 ENCOUNTER — Other Ambulatory Visit: Payer: Self-pay | Admitting: Rheumatology

## 2012-12-24 VITALS — BP 113/73 | HR 70 | Temp 97.5°F | Ht 65.0 in | Wt 132.0 lb

## 2012-12-24 DIAGNOSIS — R109 Unspecified abdominal pain: Secondary | ICD-10-CM

## 2012-12-24 DIAGNOSIS — K5900 Constipation, unspecified: Secondary | ICD-10-CM

## 2012-12-24 DIAGNOSIS — F909 Attention-deficit hyperactivity disorder, unspecified type: Secondary | ICD-10-CM

## 2012-12-24 MED ORDER — METHYLPHENIDATE HCL ER 20 MG PO TBCR: 20.0000 mg | EXTENDED_RELEASE_TABLET | ORAL | Status: DC

## 2012-12-24 NOTE — Progress Notes (Signed)
  Subjective:    Patient ID: Karen Cantu, female    DOB: 11-Oct-1985, 27 y.o.   MRN: [id number]  HPI Patient presents today for refill of concerta. She reports the medication is very effective. She reports being able to focus, complete task, and not being forgetful. Karen Cantu is questioning her dose being lowered, because at times she feels slightly drowsy.     Review of Systems  Constitutional: Negative for fever and chills.  Respiratory: Negative for chest tightness and shortness of breath.   Cardiovascular: Negative for chest pain and palpitations.  Gastrointestinal: Negative for abdominal pain.  Genitourinary: Negative for dysuria and difficulty urinating.  Neurological: Negative for dizziness, weakness and headaches.       Objective:   Physical Exam  Constitutional: She is oriented to person, place, and time. She appears well-developed and well-nourished.  Cardiovascular: Normal rate and normal heart sounds.   Pulmonary/Chest: Effort normal and breath sounds normal. No respiratory distress. She exhibits no tenderness.  Abdominal: Soft. Bowel sounds are normal. She exhibits no distension. There is no tenderness.  Neurological: She is alert and oriented to person, place, and time.  Skin: Skin is warm and dry.  Psychiatric: She has a normal mood and affect.          Assessment & Plan:  1. ADHD (attention deficit hyperactivity disorder) - methylphenidate (CONCERTA) 20 MG ER tablet; Take 1 tablet (20 mg total) by mouth every morning.  Dispense: 30 tablet; Refill: 0 -Concerta decreased from 27mg  to 20mg  daily -Patient to RTO for follow up in 1 week, medicatin effectiveness -Patient verbalized understanding -Coralie Keens, FNP-C

## 2012-12-24 NOTE — Patient Instructions (Signed)
Attention Deficit Hyperactivity Disorder Attention deficit hyperactivity disorder (ADHD) is a problem with behavior issues based on the way the brain functions (neurobehavioral disorder). It is a common reason for behavior and academic problems in school. CAUSES  The cause of ADHD is unknown in most cases. It may run in families. It sometimes can be associated with learning disabilities and other behavioral problems. SYMPTOMS  There are 3 types of ADHD. The 3 types and some of the symptoms include:  Inattentive  Gets bored or distracted easily.  Loses or forgets things. Forgets to hand in homework.  Has trouble organizing or completing tasks.  Difficulty staying on task.  An inability to organize daily tasks and school work.  Leaving projects, chores, or homework unfinished.  Trouble paying attention or responding to details. Careless mistakes.  Difficulty following directions. Often seems like is not listening.  Dislikes activities that require sustained attention (like chores or homework).  Hyperactive-impulsive  Feels like it is impossible to sit still or stay in a seat. Fidgeting with hands and feet.  Trouble waiting turn.  Talking too much or out of turn. Interruptive.  Speaks or acts impulsively.  Aggressive, disruptive behavior.  Constantly busy or on the go, noisy.  Combined  Has symptoms of both of the above. Often children with ADHD feel discouraged about themselves and with school. They often perform well below their abilities in school. These symptoms can cause problems in home, school, and in relationships with peers. As children get older, the excess motor activities can calm down, but the problems with paying attention and staying organized persist. Most children do not outgrow ADHD but with good treatment can learn to cope with the symptoms. DIAGNOSIS  When ADHD is suspected, the diagnosis should be made by professionals trained in ADHD.  Diagnosis will  include:  Ruling out other reasons for the child's behavior.  The caregivers will check with the child's school and check their medical records.  They will talk to teachers and parents.  Behavior rating scales for the child will be filled out by those dealing with the child on a daily basis. A diagnosis is made only after all information has been considered. TREATMENT  Treatment usually includes behavioral treatment often along with medicines. It may include stimulant medicines. The stimulant medicines decrease impulsivity and hyperactivity and increase attention. Other medicines used include antidepressants and certain blood pressure medicines. Most experts agree that treatment for ADHD should address all aspects of the child's functioning. Treatment should not be limited to the use of medicines alone. Treatment should include structured classroom management. The parents must receive education to address rewarding good behavior, discipline, and limit-setting. Tutoring or behavioral therapy or both should be available for the child. If untreated, the disorder can have long-term serious effects into adolescence and adulthood. HOME CARE INSTRUCTIONS   Often with ADHD there is a lot of frustration among the family in dealing with the illness. There is often blame and anger that is not warranted. This is a life long illness. There is no way to prevent ADHD. In many cases, because the problem affects the family as a whole, the entire family may need help. A therapist can help the family find better ways to handle the disruptive behaviors and promote change. If the child is young, most of the therapist's work is with the parents. Parents will learn techniques for coping with and improving their child's behavior. Sometimes only the child with the ADHD needs counseling. Your caregivers can help   you make these decisions.  Children with ADHD may need help in organizing. Some helpful tips include:  Keep  routines the same every day from wake-up time to bedtime. Schedule everything. This includes homework and playtime. This should include outdoor and indoor recreation. Keep the schedule on the refrigerator or a bulletin board where it is frequently seen. Mark schedule changes as far in advance as possible.  Have a place for everything and keep everything in its place. This includes clothing, backpacks, and school supplies.  Encourage writing down assignments and bringing home needed books.  Offer your child a well-balanced diet. Breakfast is especially important for school performance. Children should avoid drinks with caffeine including:  Soft drinks.  Coffee.  Tea.  However, some older children (adolescents) may find these drinks helpful in improving their attention.  Children with ADHD need consistent rules that they can understand and follow. If rules are followed, give small rewards. Children with ADHD often receive, and expect, criticism. Look for good behavior and praise it. Set realistic goals. Give clear instructions. Look for activities that can foster success and self-esteem. Make time for pleasant activities with your child. Give lots of affection.  Parents are their children's greatest advocates. Learn as much as possible about ADHD. This helps you become a stronger and better advocate for your child. It also helps you educate your child's teachers and instructors if they feel inadequate in these areas. Parent support groups are often helpful. A national group with local chapters is called CHADD (Children and Adults with Attention Deficit Hyperactivity Disorder). PROGNOSIS  There is no cure for ADHD. Children with the disorder seldom outgrow it. Many find adaptive ways to accommodate the ADHD as they mature. SEEK MEDICAL CARE IF:  Your child has repeated muscle twitches, cough or speech outbursts.  Your child has sleep problems.  Your child has a marked loss of  appetite.  Your child develops depression.  Your child has new or worsening behavioral problems.  Your child develops dizziness.  Your child has a racing heart.  Your child has stomach pains.  Your child develops headaches. Document Released: 01/28/2002 Document Revised: 05/02/2011 Document Reviewed: 09/10/2007 Exit[employer] Patient Information 2014 [employer].  

## 2013-01-03 ENCOUNTER — Telehealth: Payer: Self-pay | Admitting: Nurse Practitioner

## 2013-01-03 NOTE — Telephone Encounter (Signed)
Patient aware of dates

## 2013-01-22 ENCOUNTER — Other Ambulatory Visit: Payer: Self-pay | Admitting: General Practice

## 2013-01-22 ENCOUNTER — Telehealth: Payer: Self-pay | Admitting: General Practice

## 2013-01-22 ENCOUNTER — Telehealth: Payer: Self-pay | Admitting: *Deleted

## 2013-01-22 DIAGNOSIS — F909 Attention-deficit hyperactivity disorder, unspecified type: Secondary | ICD-10-CM

## 2013-01-22 MED ORDER — METHYLPHENIDATE HCL ER 20 MG PO TBCR: 20.0000 mg | EXTENDED_RELEASE_TABLET | ORAL | Status: DC

## 2013-01-22 NOTE — Telephone Encounter (Signed)
Please inform that scripts are ready, these must picked up in person.

## 2013-01-22 NOTE — Telephone Encounter (Signed)
Patient aware.

## 2013-01-22 NOTE — Telephone Encounter (Signed)
Metadate scripts ready.  Pt aware.

## 2013-01-23 ENCOUNTER — Ambulatory Visit (INDEPENDENT_AMBULATORY_CARE_PROVIDER_SITE_OTHER): Payer: BC Managed Care – PPO | Admitting: *Deleted

## 2013-01-23 DIAGNOSIS — Z309 Encounter for contraceptive management, unspecified: Secondary | ICD-10-CM

## 2013-01-23 DIAGNOSIS — IMO0001 Reserved for inherently not codable concepts without codable children: Secondary | ICD-10-CM

## 2013-01-23 MED ORDER — MEDROXYPROGESTERONE ACETATE 150 MG/ML IM SUSP
150.0000 mg | Freq: Once | INTRAMUSCULAR | Status: AC
  Administered 2013-01-23: 150 mg via INTRAMUSCULAR

## 2013-01-23 NOTE — Patient Instructions (Signed)

## 2013-01-23 NOTE — Progress Notes (Signed)
Depo provera given and tolerated well. 

## 2013-02-08 ENCOUNTER — Other Ambulatory Visit: Payer: BC Managed Care – PPO | Admitting: General Practice

## 2013-04-08 ENCOUNTER — Telehealth: Payer: Self-pay | Admitting: *Deleted

## 2013-04-08 ENCOUNTER — Ambulatory Visit (INDEPENDENT_AMBULATORY_CARE_PROVIDER_SITE_OTHER): Payer: BC Managed Care – PPO | Admitting: *Deleted

## 2013-04-08 DIAGNOSIS — Z309 Encounter for contraceptive management, unspecified: Secondary | ICD-10-CM

## 2013-04-08 DIAGNOSIS — IMO0001 Reserved for inherently not codable concepts without codable children: Secondary | ICD-10-CM

## 2013-04-08 MED ORDER — BUPROPION HCL ER (XL) 150 MG PO TB24: 150.0000 mg | ORAL_TABLET | Freq: Every day | ORAL | Status: DC

## 2013-04-08 MED ORDER — MEDROXYPROGESTERONE ACETATE 150 MG/ML IM SUSP
150.0000 mg | INTRAMUSCULAR | Status: AC
  Administered 2013-04-08 – 2013-09-20 (×3): 150 mg via INTRAMUSCULAR

## 2013-04-08 NOTE — Progress Notes (Signed)
Patient ID: Karen Cantu, female   DOB: 1985/09/16, 28 y.o.   MRN: [id number] Depo provera given and tolerated well

## 2013-04-08 NOTE — Telephone Encounter (Signed)
Patient has taken wellbutrin in the past to stop smoking and she wants to know if we can prescribe it for her again.

## 2013-04-08 NOTE — Telephone Encounter (Signed)
rx sent to pharmacy

## 2013-04-08 NOTE — Patient Instructions (Signed)

## 2013-04-09 NOTE — Telephone Encounter (Signed)
Patient aware.

## 2013-04-29 ENCOUNTER — Telehealth: Payer: Self-pay | Admitting: Nurse Practitioner

## 2013-04-29 NOTE — Telephone Encounter (Signed)
When runs out of concerta we will switch back

## 2013-04-29 NOTE — Telephone Encounter (Signed)
Pt aware.

## 2013-05-01 ENCOUNTER — Telehealth: Payer: Self-pay | Admitting: Nurse Practitioner

## 2013-05-01 DIAGNOSIS — F909 Attention-deficit hyperactivity disorder, unspecified type: Secondary | ICD-10-CM

## 2013-05-01 MED ORDER — METHYLPHENIDATE HCL ER 20 MG PO TBCR: 20.0000 mg | EXTENDED_RELEASE_TABLET | ORAL | Status: DC

## 2013-05-01 NOTE — Telephone Encounter (Signed)
rx ready for pickup 

## 2013-05-01 NOTE — Telephone Encounter (Signed)
Patient aware.

## 2013-05-01 NOTE — Telephone Encounter (Signed)
Have been on vyvanse before- NTBS to dicuss change

## 2013-05-01 NOTE — Telephone Encounter (Signed)
Will run out of concerta in 2 days. Appt scheduled for first available which is 05/08/13.  Can you provide refill to last until appt?

## 2013-05-06 ENCOUNTER — Other Ambulatory Visit: Payer: Self-pay | Admitting: Nurse Practitioner

## 2013-05-06 ENCOUNTER — Telehealth: Payer: Self-pay | Admitting: Nurse Practitioner

## 2013-05-06 DIAGNOSIS — F988 Other specified behavioral and emotional disorders with onset usually occurring in childhood and adolescence: Secondary | ICD-10-CM

## 2013-05-06 MED ORDER — LISDEXAMFETAMINE DIMESYLATE 20 MG PO CAPS: 20.0000 mg | ORAL_CAPSULE | ORAL | Status: DC

## 2013-05-06 NOTE — Telephone Encounter (Signed)
Taken care oif

## 2013-05-08 ENCOUNTER — Ambulatory Visit: Payer: BC Managed Care – PPO | Admitting: Nurse Practitioner

## 2013-06-11 ENCOUNTER — Other Ambulatory Visit (HOSPITAL_COMMUNITY): Payer: Self-pay | Admitting: Family Medicine

## 2013-06-11 DIAGNOSIS — R0602 Shortness of breath: Secondary | ICD-10-CM

## 2013-06-11 DIAGNOSIS — R5381 Other malaise: Secondary | ICD-10-CM

## 2013-06-11 DIAGNOSIS — R5383 Other fatigue: Secondary | ICD-10-CM

## 2013-06-25 ENCOUNTER — Other Ambulatory Visit: Payer: BC Managed Care – PPO | Admitting: Nurse Practitioner

## 2013-06-27 ENCOUNTER — Ambulatory Visit (INDEPENDENT_AMBULATORY_CARE_PROVIDER_SITE_OTHER): Payer: BC Managed Care – PPO | Admitting: *Deleted

## 2013-06-27 DIAGNOSIS — Z309 Encounter for contraceptive management, unspecified: Secondary | ICD-10-CM

## 2013-06-27 DIAGNOSIS — IMO0001 Reserved for inherently not codable concepts without codable children: Secondary | ICD-10-CM

## 2013-06-27 NOTE — Patient Instructions (Signed)

## 2013-06-27 NOTE — Progress Notes (Signed)
Depo provera given and tolerated well. 

## 2013-08-10 ENCOUNTER — Other Ambulatory Visit: Payer: Self-pay | Admitting: Nurse Practitioner

## 2013-08-22 ENCOUNTER — Telehealth: Payer: Self-pay | Admitting: Nurse Practitioner

## 2013-08-22 MED ORDER — NITROFURANTOIN MONOHYD MACRO 100 MG PO CAPS: 100.0000 mg | ORAL_CAPSULE | Freq: Two times a day (BID) | ORAL | Status: DC

## 2013-08-22 NOTE — Telephone Encounter (Signed)
[place] UTI can we please call in abx for her please she is having frequent urination with burning.

## 2013-08-22 NOTE — Telephone Encounter (Signed)
Patient aware.

## 2013-09-17 ENCOUNTER — Other Ambulatory Visit: Payer: Self-pay | Admitting: Nurse Practitioner

## 2013-09-18 NOTE — Telephone Encounter (Signed)
Last seen 09/25/12  MMM

## 2013-09-20 ENCOUNTER — Ambulatory Visit (INDEPENDENT_AMBULATORY_CARE_PROVIDER_SITE_OTHER): Payer: BC Managed Care – PPO | Admitting: *Deleted

## 2013-09-20 DIAGNOSIS — Z308 Encounter for other contraceptive management: Secondary | ICD-10-CM

## 2013-09-20 NOTE — Progress Notes (Signed)
Patient tolerated well. Next injection due 10/16-10/30

## 2013-11-13 ENCOUNTER — Other Ambulatory Visit (HOSPITAL_COMMUNITY)
Admission: RE | Admit: 2013-11-13 | Discharge: 2013-11-13 | Disposition: A | Discharge status: Home / Self Care | Payer: BC Managed Care – PPO | Source: Ambulatory Visit | Attending: Obstetrics and Gynecology | Admitting: Obstetrics and Gynecology

## 2013-11-13 ENCOUNTER — Ambulatory Visit (INDEPENDENT_AMBULATORY_CARE_PROVIDER_SITE_OTHER): Payer: BC Managed Care – PPO | Admitting: Obstetrics and Gynecology

## 2013-11-13 ENCOUNTER — Encounter: Payer: Self-pay | Admitting: Obstetrics and Gynecology

## 2013-11-13 VITALS — BP 108/60 | Ht 65.0 in | Wt 141.0 lb

## 2013-11-13 DIAGNOSIS — R87810 Cervical high risk human papillomavirus (HPV) DNA test positive: Secondary | ICD-10-CM | POA: Diagnosis present

## 2013-11-13 DIAGNOSIS — Z1151 Encounter for screening for human papillomavirus (HPV): Secondary | ICD-10-CM | POA: Insufficient documentation

## 2013-11-13 DIAGNOSIS — Z01419 Encounter for gynecological examination (general) (routine) without abnormal findings: Secondary | ICD-10-CM | POA: Diagnosis present

## 2013-11-13 MED ORDER — TRICHLOROACETIC ACID 80 % EX LIQD: 1.0000 "application " | Freq: Once | CUTANEOUS | Status: DC

## 2013-11-13 NOTE — Patient Instructions (Signed)
Lubricants:  ° °[place]  °Feminease  °Gynemoistrin  ° °

## 2013-11-13 NOTE — Progress Notes (Signed)
Assessment:  Annual Gyn Exam   Plan:  1. pap smear done, next pap due 3 years 2. return annually or prn 3    Annual mammogram advised Subjective:  Karen Cantu is a 28 y.o. female No obstetric history on file. who presents for annual exam. No LMP recorded. Patient has had an injection. The patient has complaints today of a stinging sensation on the right side of her vagina, only hurts during intercourse, and has been this way for about 5 weeks. She states that it feels like a cut, and when semen touched the area, it felt like salt on a wound. She states that she has felt inside her vagina and has not felt anything. She states that when she urinates after sex it is painful. She states that she went to buy lubricate for it. She denies dysuria and any other associated symptoms. She states that she does not have children. She states that she does breast self-exams and she has not found anything noticeable.    The following portions of the patient's history were reviewed and updated as appropriate: allergies, current medications, past family history, past medical history, past social history, past surgical history and problem list. Past Medical History  Diagnosis Date  . Irritable bowel syndrome   . Migraine   . Attention deficit disorder     Past Surgical History  Procedure Laterality Date  . Colonoscopy  04/09/2007    [facility]:XNATFT rectum, colon, terminal ileum    Current outpatient prescriptions:ALPRAZolam (XANAX) 0.5 MG tablet, , Disp: , Rfl: ;  buPROPion (WELLBUTRIN XL) 150 MG 24 hr tablet, TAKE 1 TABLET (150 MG TOTAL) BY MOUTH DAILY., Disp: 30 tablet, Rfl: 2;  cyclobenzaprine (FLEXERIL) 10 MG tablet, Take 10 mg by mouth 3 (three) times daily as needed for muscle spasms., Disp: , Rfl: ;  HYDROcodone-acetaminophen (NORCO) 10-325 MG per tablet, , Disp: , Rfl:  medroxyPROGESTERone (DEPO-PROVERA) 150 MG/ML injection, INJECT 1ML INTRAMUSCULARLY ONCE EVERY 3 MONTHS, Disp: 1 mL, Rfl: 0;   NEXIUM 40 MG capsule, , Disp: , Rfl: ;  tiZANidine (ZANAFLEX) 4 MG tablet, , Disp: , Rfl:   Review of Systems Constitutional: negative Gastrointestinal: negative Genitourinary: dyspareunia  Objective:  BP 108/60  Ht 5\' 5"  (1.651 m)  Wt 141 lb (63.957 kg)  BMI 23.46 kg/m2   BMI: Body mass index is 23.46 kg/(m^2).  General Appearance: Alert, appropriate appearance for age. No acute distress HEENT: Grossly normal Neck / Thyroid:  Cardiovascular: RRR; normal S1, S2, no murmur Lungs: CTA bilaterally Back: No CVAT Breast Exam: Normal to inspection, Normal breast tissue bilaterally and No masses or nodes.No dimpling, nipple retraction or discharge. Gastrointestinal: Soft, non-tender, no masses or organomegaly Pelvic Exam: Vulva and vagina appear normal. Bimanual exam reveals normal uterus and adnexa. Vaginal: normal mucosa without prolapse or lesions, normal without tenderness, induration or masses and normal rugae Cervix: normal appearance Adnexa: normal bimanual exam Uterus: normal single, nontender Rectovaginal: not indicated Lymphatic Exam: Non-palpable nodes in neck, clavicular, axillary, or inguinal regions  Skin: no rash or abnormalities Neurologic: Normal gait and speech, no tremor  Psychiatric: Alert and oriented, appropriate affect.  Urinalysis:Not done  Mallory Shirk. MD Pgr [phone] 4:04 PM  A:  1. 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 2. Dyspareunia due to skin cracking to at enteritis.    P: 1. Depo-Provera every 3 months 2. Pap smear every 3 years Consider switch to other contraception since she's been on depo x 15 yrs tx codyloma with  tca soon This chart was scribed for [name], MD by Steva Colder, ED Scribe. The patient was seen in room 1 at 4:01 PM.

## 2013-11-13 NOTE — Progress Notes (Signed)
Patient ID: Karen Cantu, female   DOB: 09/15/1985, 28 y.o.   MRN: [id number] Pt here today for annual exam. Pt states that she has a stinging sensation on the right side of her vagina, only hurts during intercourse, and has been this way for about 5 weeks.

## 2013-11-18 ENCOUNTER — Telehealth: Payer: Self-pay | Admitting: Obstetrics and Gynecology

## 2013-11-18 ENCOUNTER — Other Ambulatory Visit: Payer: Self-pay | Admitting: Obstetrics and Gynecology

## 2013-11-18 DIAGNOSIS — A630 Anogenital (venereal) warts: Secondary | ICD-10-CM

## 2013-11-18 LAB — CYTOLOGY - PAP

## 2013-11-18 MED ORDER — TRICHLOROACETIC ACID 80 % EX LIQD: 1.0000 "application " | Freq: Once | CUTANEOUS | Status: DC

## 2013-11-18 NOTE — Telephone Encounter (Signed)
Pt states that the TCA is over 100 dollars at [facility] and she stated that Dr. Glo Herring told her that it costs 40 dollars at [facility]. Pt would like Dr. Glo Herring to send the Rx to [facility] instead. Pt also wanted Pap results, results were not back yet so I advised the pt that she could call back on Friday if she wanted to or she could wait until the letter is mailed. I advised the pt that I would send this message to Dr. Glo Herring and get him to send the Rx to [facility].   Pt verbalized understanding.

## 2013-11-18 NOTE — Telephone Encounter (Signed)
[facility] will call pt.

## 2013-11-29 ENCOUNTER — Other Ambulatory Visit: Payer: Self-pay | Admitting: Nurse Practitioner

## 2013-12-02 ENCOUNTER — Telehealth: Payer: Self-pay | Admitting: Nurse Practitioner

## 2013-12-02 ENCOUNTER — Telehealth: Payer: Self-pay | Admitting: *Deleted

## 2013-12-02 ENCOUNTER — Ambulatory Visit: Payer: BC Managed Care – PPO

## 2013-12-02 NOTE — Telephone Encounter (Signed)
Message copied by Doyne Keel on Mon Dec 02, 2013  9:00 AM ------      Message from: [name]      Created: Mon Nov 18, 2013  6:34 PM       Pap negative with + HPV, so repeat pap in 1 yr. ------

## 2013-12-02 NOTE — Telephone Encounter (Signed)
Message copied by Doyne Keel on Mon Dec 02, 2013  9:06 AM ------      Message from: [name]      Created: Mon Nov 18, 2013  6:34 PM       Pap negative with + HPV, so repeat pap in 1 yr. ------

## 2013-12-02 NOTE — Telephone Encounter (Signed)
Pt informed of positive HPV on pap from 11/13/2013. All questions answered pt to repeat pap in 1 year. Pt verbalized understanding.

## 2013-12-02 NOTE — Telephone Encounter (Signed)
Refill sent in electronically. Patient aware.

## 2013-12-10 ENCOUNTER — Ambulatory Visit (INDEPENDENT_AMBULATORY_CARE_PROVIDER_SITE_OTHER): Payer: BC Managed Care – PPO

## 2013-12-10 DIAGNOSIS — Z3042 Encounter for surveillance of injectable contraceptive: Secondary | ICD-10-CM

## 2013-12-10 MED ORDER — MEDROXYPROGESTERONE ACETATE 150 MG/ML IM SUSP
150.0000 mg | Freq: Once | INTRAMUSCULAR | Status: AC
  Administered 2013-12-10: 150 mg via INTRAMUSCULAR

## 2013-12-11 ENCOUNTER — Encounter: Payer: Self-pay | Admitting: Obstetrics and Gynecology

## 2013-12-11 ENCOUNTER — Ambulatory Visit: Payer: BC Managed Care – PPO | Admitting: Obstetrics and Gynecology

## 2013-12-17 DIAGNOSIS — R0602 Shortness of breath: Secondary | ICD-10-CM | POA: Insufficient documentation

## 2013-12-17 DIAGNOSIS — R9431 Abnormal electrocardiogram [ECG] [EKG]: Secondary | ICD-10-CM | POA: Insufficient documentation

## 2013-12-17 DIAGNOSIS — I471 Supraventricular tachycardia: Secondary | ICD-10-CM | POA: Insufficient documentation

## 2013-12-17 DIAGNOSIS — R0789 Other chest pain: Secondary | ICD-10-CM | POA: Insufficient documentation

## 2013-12-17 DIAGNOSIS — R0609 Other forms of dyspnea: Secondary | ICD-10-CM | POA: Insufficient documentation

## 2013-12-27 ENCOUNTER — Telehealth: Payer: Self-pay | Admitting: Family Medicine

## 2013-12-27 ENCOUNTER — Telehealth: Payer: Self-pay | Admitting: Nurse Practitioner

## 2013-12-27 ENCOUNTER — Other Ambulatory Visit: Payer: Self-pay | Admitting: Nurse Practitioner

## 2013-12-27 MED ORDER — LISDEXAMFETAMINE DIMESYLATE 20 MG PO CAPS: 20.0000 mg | ORAL_CAPSULE | Freq: Every day | ORAL | Status: DC

## 2013-12-27 NOTE — Telephone Encounter (Signed)
aware

## 2013-12-27 NOTE — Telephone Encounter (Signed)
vyvanse rx ready for pick up  

## 2013-12-27 NOTE — Telephone Encounter (Signed)
Already dome earlier today

## 2013-12-30 NOTE — Telephone Encounter (Signed)
Patient picked up RX.

## 2014-01-25 ENCOUNTER — Other Ambulatory Visit: Payer: Self-pay | Admitting: Nurse Practitioner

## 2014-02-24 ENCOUNTER — Ambulatory Visit: Payer: BC Managed Care – PPO | Admitting: Nurse Practitioner

## 2014-02-27 ENCOUNTER — Ambulatory Visit: Payer: BC Managed Care – PPO

## 2014-03-04 ENCOUNTER — Ambulatory Visit (INDEPENDENT_AMBULATORY_CARE_PROVIDER_SITE_OTHER): Payer: BLUE CROSS/BLUE SHIELD | Admitting: *Deleted

## 2014-03-04 DIAGNOSIS — Z3042 Encounter for surveillance of injectable contraceptive: Secondary | ICD-10-CM

## 2014-03-04 MED ORDER — MEDROXYPROGESTERONE ACETATE 150 MG/ML IM SUSP
150.0000 mg | Freq: Once | INTRAMUSCULAR | Status: AC
  Administered 2014-03-04: 150 mg via INTRAMUSCULAR

## 2014-03-04 NOTE — Progress Notes (Signed)
Patient ID: Karen Cantu, female   DOB: Apr 04, 1985, 29 y.o.   MRN: [id number] Pt tolerated inj well

## 2014-03-09 ENCOUNTER — Other Ambulatory Visit: Payer: Self-pay | Admitting: Nurse Practitioner

## 2014-03-10 NOTE — Telephone Encounter (Signed)
Patient missed appointment earlier this month.

## 2014-04-02 ENCOUNTER — Encounter (HOSPITAL_COMMUNITY): Payer: Self-pay

## 2014-04-02 DIAGNOSIS — Z79899 Other long term (current) drug therapy: Secondary | ICD-10-CM | POA: Diagnosis not present

## 2014-04-02 DIAGNOSIS — F909 Attention-deficit hyperactivity disorder, unspecified type: Secondary | ICD-10-CM | POA: Insufficient documentation

## 2014-04-02 DIAGNOSIS — G43909 Migraine, unspecified, not intractable, without status migrainosus: Secondary | ICD-10-CM | POA: Diagnosis not present

## 2014-04-02 DIAGNOSIS — L03211 Cellulitis of face: Secondary | ICD-10-CM | POA: Diagnosis not present

## 2014-04-02 DIAGNOSIS — Z880 Allergy status to penicillin: Secondary | ICD-10-CM | POA: Diagnosis not present

## 2014-04-02 DIAGNOSIS — Z87891 Personal history of nicotine dependence: Secondary | ICD-10-CM | POA: Diagnosis not present

## 2014-04-02 DIAGNOSIS — K088 Other specified disorders of teeth and supporting structures: Secondary | ICD-10-CM | POA: Diagnosis present

## 2014-04-02 DIAGNOSIS — Z8719 Personal history of other diseases of the digestive system: Secondary | ICD-10-CM | POA: Insufficient documentation

## 2014-04-02 NOTE — ED Notes (Signed)
Patient right lower wisdom tooth removed Monday, since then she states pain and swelling has been getting progressively worse. States she followed all post op instructions, Tynelol last took 1800 and Demerol last took at 2115, was given antibiotic prescription today when she went back [place] [employer], states he did not look into her mouth today.

## 2014-04-03 ENCOUNTER — Emergency Department (HOSPITAL_COMMUNITY)
Admission: EM | Admit: 2014-04-03 | Discharge: 2014-04-03 | Disposition: A | Discharge status: Home / Self Care | Payer: BLUE CROSS/BLUE SHIELD | Attending: Emergency Medicine | Admitting: Emergency Medicine

## 2014-04-03 ENCOUNTER — Emergency Department (HOSPITAL_COMMUNITY): Payer: BLUE CROSS/BLUE SHIELD

## 2014-04-03 DIAGNOSIS — L03211 Cellulitis of face: Secondary | ICD-10-CM

## 2014-04-03 DIAGNOSIS — R220 Localized swelling, mass and lump, head: Secondary | ICD-10-CM

## 2014-04-03 LAB — CBC WITH DIFFERENTIAL/PLATELET
BASOS PCT: 0 % (ref 0–1)
Basophils Absolute: 0 10*3/uL (ref 0.0–0.1)
Eosinophils Absolute: 0.1 10*3/uL (ref 0.0–0.7)
Eosinophils Relative: 0 % (ref 0–5)
HCT: 41.3 % (ref 36.0–46.0)
Hemoglobin: 14 g/dL (ref 12.0–15.0)
Lymphocytes Relative: 28 % (ref 12–46)
Lymphs Abs: 3.2 10*3/uL (ref 0.7–4.0)
MCH: 29.7 pg (ref 26.0–34.0)
MCHC: 33.9 g/dL (ref 30.0–36.0)
MCV: 87.7 fL (ref 78.0–100.0)
MONO ABS: 1 10*3/uL (ref 0.1–1.0)
Monocytes Relative: 8 % (ref 3–12)
NEUTROS PCT: 64 % (ref 43–77)
Neutro Abs: 7.4 10*3/uL (ref 1.7–7.7)
Platelets: 277 10*3/uL (ref 150–400)
RBC: 4.71 MIL/uL (ref 3.87–5.11)
RDW: 11.6 % (ref 11.5–15.5)
WBC: 11.7 10*3/uL — ABNORMAL HIGH (ref 4.0–10.5)

## 2014-04-03 LAB — BASIC METABOLIC PANEL
Anion gap: 7 (ref 5–15)
BUN: 7 mg/dL (ref 6–23)
CO2: 27 mmol/L (ref 19–32)
Calcium: 9.3 mg/dL (ref 8.4–10.5)
Chloride: 103 mmol/L (ref 96–112)
Creatinine, Ser: 0.7 mg/dL (ref 0.50–1.10)
GFR calc Af Amer: >90 mL/min (ref >90)
GFR calc non Af Amer: >90 mL/min (ref >90)
GLUCOSE: 95 mg/dL (ref 70–99)
Potassium: 3.4 mmol/L — ABNORMAL LOW (ref 3.5–5.1)
Sodium: 137 mmol/L (ref 135–145)

## 2014-04-03 MED ORDER — CLINDAMYCIN PHOSPHATE 600 MG/50ML IV SOLN
600.0000 mg | Freq: Once | INTRAVENOUS | Status: AC
  Administered 2014-04-03: 600 mg via INTRAVENOUS
  Filled 2014-04-03: qty 50

## 2014-04-03 MED ORDER — ONDANSETRON HCL 4 MG/2ML IJ SOLN
4.0000 mg | Freq: Once | INTRAMUSCULAR | Status: AC
  Administered 2014-04-03: 4 mg via INTRAMUSCULAR
  Filled 2014-04-03: qty 2

## 2014-04-03 MED ORDER — MORPHINE SULFATE 4 MG/ML IJ SOLN
4.0000 mg | Freq: Once | INTRAMUSCULAR | Status: AC
  Administered 2014-04-03: 4 mg via INTRAVENOUS
  Filled 2014-04-03: qty 1

## 2014-04-03 MED ORDER — IOHEXOL 300 MG/ML  SOLN
80.0000 mL | Freq: Once | INTRAMUSCULAR | Status: AC | PRN
  Administered 2014-04-03: 80 mL via INTRAVENOUS

## 2014-04-03 MED ORDER — HYDROCODONE-ACETAMINOPHEN 5-325 MG PO TABS: 1.0000 | ORAL_TABLET | Freq: Four times a day (QID) | ORAL | Status: DC | PRN

## 2014-04-03 MED ORDER — SODIUM CHLORIDE 0.9 % IV BOLUS (SEPSIS)
1000.0000 mL | Freq: Once | INTRAVENOUS | Status: AC
  Administered 2014-04-03: 1000 mL via INTRAVENOUS

## 2014-04-03 MED ORDER — CLINDAMYCIN HCL 300 MG PO CAPS: 300.0000 mg | ORAL_CAPSULE | Freq: Four times a day (QID) | ORAL | Status: DC

## 2014-04-03 NOTE — Discharge Instructions (Signed)
Clindamycin as prescribed.  Hydrocodone as prescribed as needed for pain.  Call your dentist to arrange a follow-up appointment in the next 2-3 days.   Cellulitis Cellulitis is an infection of the skin and the tissue beneath it. The infected area is usually red and tender. Cellulitis occurs most often in the arms and lower legs.  CAUSES  Cellulitis is caused by bacteria that enter the skin through cracks or cuts in the skin. The most common types of bacteria that cause cellulitis are staphylococci and streptococci. SIGNS AND SYMPTOMS   Redness and warmth.  Swelling.  Tenderness or pain.  Fever. DIAGNOSIS  Your health care provider can usually determine what is wrong based on a physical exam. Blood tests may also be done. TREATMENT  Treatment usually involves taking an antibiotic medicine. HOME CARE INSTRUCTIONS   Take your antibiotic medicine as directed by your health care provider. Finish the antibiotic even if you start to feel better.  Keep the infected arm or leg elevated to reduce swelling.  Apply a warm cloth to the affected area up to 4 times per day to relieve pain.  Take medicines only as directed by your health care provider.  Keep all follow-up visits as directed by your health care provider. SEEK MEDICAL CARE IF:   You notice red streaks coming from the infected area.  Your red area gets larger or turns dark in color.  Your bone or joint underneath the infected area becomes painful after the skin has healed.  Your infection returns in the same area or another area.  You notice a swollen bump in the infected area.  You develop new symptoms.  You have a fever. SEEK IMMEDIATE MEDICAL CARE IF:   You feel very sleepy.  You develop vomiting or diarrhea.  You have a general ill feeling (malaise) with muscle aches and pains. MAKE SURE YOU:   Understand these instructions.  Will watch your condition.  Will get help right away if you are not doing  well or get worse. Document Released: 11/17/2004 Document Revised: 06/24/2013 Document Reviewed: 04/25/2011 [facility] Patient Information 2015 [place], [place]. This information is not intended to replace advice given to you by your health care provider. Make sure you discuss any questions you have with your health care provider.    Emergency Department Resource Guide 1) Find a Doctor and Pay Out of Pocket Although you won't have to find out who is covered by your insurance plan, it is a good idea to ask around and get recommendations. You will then need to call the office and see if the doctor you have chosen will accept you as a new patient and what types of options they offer for patients who are self-pay. Some doctors offer discounts or will set up payment plans for their patients who do not have insurance, but you will need to ask so you aren't surprised when you get to your appointment.  2) Contact Your Local Health Department Not all health departments have doctors that can see patients for sick visits, but many do, so it is worth a call to see if yours does. If you don't know where your local health department is, you can check in your phone book. The CDC also has a tool to help you locate your state's health department, and many state websites also have listings of all of their local health departments.  3) Find a [facility] If your illness is not likely to be very severe or complicated, you  may want to try a walk in clinic. These are popping up all over the country in pharmacies, drugstores, and shopping centers. They're usually staffed by nurse practitioners or physician assistants that have been trained to treat common illnesses and complaints. They're usually fairly quick and inexpensive. However, if you have serious medical issues or chronic medical problems, these are probably not your best option.  No Primary Care Doctor: - Call Health Connect at  [phone] - they can help you  locate a primary care doctor that  accepts your insurance, provides certain services, etc. - Physician Referral Service- [phone]  Chronic Pain Problems: Organization         Address  Phone   Notes  [facility]  [phone] Patients need to be referred by their primary care doctor.   Medication Assistance: Organization         Address  Phone   Notes  [facility] Medication [facility] [place], [place] [address] [phone] --Must be a resident of [facility] -- Must have NO insurance coverage whatsoever (no Medicaid/ Medicare, etc.) -- The pt. MUST have a primary care doctor that directs their care regularly and follows them in the community   Med[facility]ist  [phone]   [employer]  [phone]    Agencies that provide inexpensive medical care: Organization         Address  Phone   Notes  [place]  [phone]   [facility]    [phone]   [facility] [place], [place] [address] [phone]   [place] [address] [place] [phone]   Planned Parenthood    [phone]   [facility]    [phone]   [place] and [address], [place] Phone:  [phone], Fax:  [phone] Hours of Operation:  9 am - 6 pm, M-F.  Also accepts Medicaid/Medicare and self-pay.  [facility] for [place] [address], [address], [place] Phone: [phone], Fax: [phone]. Hours of Operation:  8:30 am - 5:30 pm, M-F.  Also accepts Medicaid and self-pay.  [facility] [place] [address], [place] Phone: [phone]   [place], [place], [place] [phone], Ext. [phone] Mondays & Thursdays: 7-9 AM.  First 15 patients are seen on a first come, first serve basis.    [place]  Providers:  Organization         Address  Phone   Notes  [facility] [address], [place] [phone] Also accepts self-pay patients.  [facility] [address], [place]  [phone]   [place], [address], [place] [phone]   [facility] [address], [place] [phone]   Lucianne Lei [address], [place]   [phone] Only accepts [place] Access [place] patients after they have their name applied to their card.   Self-Pay (no insurance) in [facility]:  Organization         Address  Phone   Notes  Sickle Cell Patients, [facility] [place] [phone]   [facility] Urgent Care [place] (  [facility] [place]  [address], [address],  [phone]   [facility]/Dr. Osei-Bonsu  [address] [place] or [address], [place] [phone] Phone number for both [place] and [place] locations is the same.  Urgent Medical and [facility] [address], [place] [phone]   [facility] [address], [place] or [address] [phone]   [facility] [place] [address], [place] [phone], phone; [phone], fax Sees patients 1st and 3rd Saturday of every month.  Must not qualify for public or private insurance (i.e. Medicaid, Medicare, [place] Health Choice, Veterans' Benefits)  Household income should be no more than 200% of the poverty level The clinic cannot treat you if you are pregnant or think you are pregnant  Sexually transmitted diseases are not treated at the clinic.    Dental Care: Organization         Address  Phone  Notes  [facility] Department of [facility] [phone] Accepts children up to age 11 who are enrolled in [place] or [place]; pregnant women with a Medicaid card; and children who have applied for Medicaid or [place] Health Choice, but were declined, whose parents can pay a reduced fee at time of service.  [employer] Department of [facility]  [address], [place] [phone] Accepts children up to age 40 who are enrolled in [place] or [place]; pregnant women with a Medicaid card; and children who have applied for Medicaid or [place] Health Choice, but were declined, whose parents can pay a reduced fee at time of service.  [facility]  [place] [phone] Patients are seen by appointment only. Walk-ins are not accepted. [place] will see patients 6 years of age and older. Monday - Tuesday (8am-5pm) Most Wednesdays (8:30-5pm) $30 per visit, cash only  [facility]  [address], [facility] [phone] Patients are seen by appointment only. Walk-ins are not accepted. [place] will see patients 54 years of age and older. One Wednesday Evening (Monthly: Volunteer Based).  $30 per visit, cash only  [place]  [phone] for adults; Children under age 85, call Graduate Pediatric Dentistry at [phone]. Children aged 43-14, please call [phone] to request a pediatric application.  Dental services are provided in all areas of dental care including fillings, crowns and bridges, complete and partial dentures, implants, gum treatment, root canals, and extractions. Preventive care is also provided. Treatment is provided to both adults and children. Patients are selected via a lottery and there is often a waiting list.   [facility] [address] [place]  [phone] [email or web address]   [facility]  [address] [place], [place] [phone], Ext. [phone] Second and Fourth Thursday of each month, opens at 6:30 AM; Clinic ends at 9 AM.  Patients are seen on a first-come first-served basis, and a limited number are seen during each clinic.   [facility]  [address] Hillard Danker [place], [place] [phone]   Eligibility Requirements You must have lived in [place], [place], or [place] counties for at least the last three months.   You cannot be eligible for  state or Museum/gallery conservator, including [employer], [place], or [employer].   You generally cannot be eligible for healthcare insurance through your employer.    How to apply: Eligibility screenings are held every Tuesday and Wednesday afternoon from 1:00 pm until 4:00 pm. You do not need an appointment for the interview!  [facility] [address], [place], [place]   [place]  [place] Department  [place]  [phone]    Behavioral Health Resources in the Community: Intensive Outpatient Programs Organization         Address  Phone  Notes  [place]. [address], [place], [place] [phone]   [facility] Outpatient [address] [place], [place]   ADS: [facility] [address] [place], [place]   [place] [address]  [place], [place] or [phone]   Substance Abuse Resources Organization         Address  Phone  Notes  Alcohol and Drug Services  [phone]   [place]  [phone]   [facility] [place]   Chinita Pester  [phone]   Residential & Outpatient Substance Abuse Program  [phone]   Psychological Services Organization         Address  Phone  Notes  [facility] [place]  [place]  [phone]   [place] [address], [place] or [phone]    Mobile Crisis Teams Organization         Address  Phone  Notes  [facility], Mobile Crisis Care Unit  [phone]   [facility]  [address] [place], [place]   [place] Levels [address], [place] [phone]    Self-Help/Support Groups Organization         Address  Phone             Notes  [place]. of [place] - variety of support groups  [place] Call for more information  Narcotics Anonymous (NA), [facility] [address], [employer] [place]  2 meetings at this location   Special educational needs teacher         Address  Phone  Notes  [facility] [place],    [place]  [phone]   [facility]  [address], [place] [id number], [place], [place]   [place], [place] [phone] Admissions: 8am-3pm M-F  Incentives Substance [place] [address]    [place], [place] [phone]   [facility] [address] Jadene Pierini [place], [place]   [facility] [address],  [place], [place]   [facility] - Intensive Outpatient [address], [place], [place]   [facility] ([place].) [place].,  [place], [place] [phone] or [phone]   [facility] (RTS) [address], [place], [place] Accepts Medicaid  [facility] [place] [address]  [place] [phone] Substance Abuse/Addiction Treatment   [facility] Organization         Address  Phone  Notes  [facility]  [phone]   Domenic Schwab, PhD [place], [address] [place], [place]   [phone]  [phone] or [phone]) [phone]    [facility]   [address] [place], [place] [phone]   [address], [place], [place] [phone] Insurance/Medicaid/sponsorship through [facility] and Families [address], [address] [place]                                    [place], [place] [phone] [place] [address] [place] [address]   [place], [place] [phone]    Dr. Adele Schilder  [phone]   [facility] Dept. 1) [address], [place] 2) [place] 3)  [place] [address], [place] [phone]  [phone]   [place] [phone] or [phone] (After Hours)

## 2014-04-03 NOTE — ED Notes (Signed)
Called pt to room and no answer

## 2014-04-03 NOTE — ED Provider Notes (Signed)
CSN: [id number]     Arrival date & time 04/02/14  2246 History   First MD Initiated Contact with Patient 04/03/14 0003     Chief Complaint  Patient presents with  . Dental Problem     (Consider location/radiation/quality/duration/timing/severity/associated sxs/prior Treatment) The history is provided by the patient.   Karen Cantu is a 29 y.o. female presenting with right dental pain along with right mouth, cheek, jaw and neck swelling since undergoing a right lower 3rd molar extraction 2 days ago secondary to the molar being impacted.  She was seen by the dentist in followup today and was prescribed demerol which does not relieve her pain and keflex which she has not started taking over concern about pcn allergy.  She has increasing problems opening her mouth and has been unable to tolerate PO intake today.  She reports low grade subjective fever. She denies nausea, vomiting, abdominal pain, shortness of breath and chest pain.     Past Medical History  Diagnosis Date  . Irritable bowel syndrome   . Migraine   . Attention deficit disorder    Past Surgical History  Procedure Laterality Date  . Colonoscopy  04/09/2007    AOZ:HYQMVH rectum, colon, terminal ileum   Family History  Problem Relation Age of Onset  . Anxiety disorder Mother   . Anxiety disorder Father   . Anxiety disorder Brother    History  Substance Use Topics  . Smoking status: Former Research scientist (life sciences)  . Smokeless tobacco: Never Used  . Alcohol Use: No     Comment: occ.   OB History    No data available     Review of Systems  Constitutional: Negative for fever.  HENT: Positive for dental problem. Negative for facial swelling and sore throat.   Respiratory: Negative for shortness of breath.   Musculoskeletal: Negative for neck pain and neck stiffness.      Allergies  Penicillins and Sumatriptan  Home Medications   Prior to Admission medications   Medication Sig Start Date End Date Taking? Authorizing  Provider  ALPRAZolam [name]) 0.5 MG tablet  10/09/12   Historical Provider, MD  buPROPion (WELLBUTRIN XL) 150 MG 24 hr tablet TAKE 1 TABLET (150 MG TOTAL) BY MOUTH DAILY. 01/27/14   [name] Done, FNP  buPROPion (WELLBUTRIN XL) 150 MG 24 hr tablet TAKE 1 TABLET (150 MG TOTAL) BY MOUTH DAILY. 03/10/14   [name] Done, FNP  cyclobenzaprine (FLEXERIL) 10 MG tablet Take 10 mg by mouth 3 (three) times daily as needed for muscle spasms.    Historical Provider, MD  HYDROcodone-acetaminophen [facility]) 10-325 MG per tablet  10/15/13   Historical Provider, MD  lisdexamfetamine (VYVANSE) 20 MG capsule Take 1 capsule (20 mg total) by mouth daily. 12/27/13   [name] Done, FNP  medroxyPROGESTERone (DEPO-PROVERA) 150 MG/ML injection Inject 1 mL (150 mg total) into the muscle every 3 (three) months. 12/02/13   [name] Done, FNP  NEXIUM 40 MG capsule  11/11/13   Historical Provider, MD  tiZANidine (ZANAFLEX) 4 MG tablet  09/25/12   Historical Provider, MD  trichloroacetic acid 80 % LIQD Apply 1 application topically once. 11/18/13   [name], MD   Pulse 89  Temp(Src) 98.1 F (36.7 C) (Oral)  Resp 16  Ht 5\' 5"  (1.651 m)  Wt 138 lb (62.596 kg)  BMI 22.96 kg/m2  SpO2 99% Physical Exam  Constitutional: She is oriented to person, place, and time. She appears well-developed and well-nourished. No distress.  HENT:  Head: Normocephalic  and atraumatic.  Right Ear: Tympanic membrane and external ear normal.  Left Ear: Tympanic membrane and external ear normal.  Mouth/Throat: Oropharynx is clear and moist and mucous membranes are normal. No oral lesions. There is trismus in the jaw. No posterior oropharyngeal edema or posterior oropharyngeal erythema.  TTP with large induration right lateral mandible, warm,  Erythematous.  Tenderness with subtle soft edema right lateral neck.  Trismus prevents being able to view the extraction site.    Eyes: Conjunctivae are normal.  Neck: Normal range of  motion. Neck supple.  Cardiovascular: Normal rate and normal heart sounds.   Pulmonary/Chest: Effort normal.  Abdominal: She exhibits no distension.  Musculoskeletal: Normal range of motion.  Lymphadenopathy:    She has no cervical adenopathy.  Neurological: She is alert and oriented to person, place, and time.  Skin: Skin is warm and dry. No erythema.  Psychiatric: She has a normal mood and affect.    ED Course  Procedures (including critical care time) Labs Review Labs Reviewed  CBC WITH DIFFERENTIAL/PLATELET - Abnormal; Notable for the following:    WBC 11.7 (*)    All other components within normal limits  BASIC METABOLIC PANEL - Abnormal; Notable for the following:    Potassium 3.4 (*)    All other components within normal limits    Imaging Review No results found.   EKG Interpretation None      MDM   Final diagnoses:  Right facial swelling    Pt with moderate facial edema, suspect significant dental infection/abscess as complication of dental extraction.  She was given morphine per IV,  Clindamycin 600 mg IV ordered.  Discussed with Dr. Stark Jock who will dispo pt once CT results obtained.    Evalee Jefferson, PA-C 04/03/14 0214  Evalee Jefferson, PA-C 04/03/14 0076  Veryl Speak, MD 04/03/14 [phone]

## 2014-04-04 ENCOUNTER — Ambulatory Visit: Payer: Self-pay | Admitting: Nurse Practitioner

## 2014-04-05 ENCOUNTER — Encounter (HOSPITAL_COMMUNITY): Payer: Self-pay | Admitting: *Deleted

## 2014-04-05 ENCOUNTER — Emergency Department (HOSPITAL_COMMUNITY)
Admission: EM | Admit: 2014-04-05 | Discharge: 2014-04-05 | Disposition: A | Discharge status: Home / Self Care | Payer: BLUE CROSS/BLUE SHIELD | Attending: Emergency Medicine | Admitting: Emergency Medicine

## 2014-04-05 DIAGNOSIS — Z8719 Personal history of other diseases of the digestive system: Secondary | ICD-10-CM | POA: Insufficient documentation

## 2014-04-05 DIAGNOSIS — Z79899 Other long term (current) drug therapy: Secondary | ICD-10-CM | POA: Diagnosis not present

## 2014-04-05 DIAGNOSIS — K0889 Other specified disorders of teeth and supporting structures: Secondary | ICD-10-CM

## 2014-04-05 DIAGNOSIS — Z87891 Personal history of nicotine dependence: Secondary | ICD-10-CM | POA: Insufficient documentation

## 2014-04-05 DIAGNOSIS — Z880 Allergy status to penicillin: Secondary | ICD-10-CM | POA: Diagnosis not present

## 2014-04-05 DIAGNOSIS — L03211 Cellulitis of face: Secondary | ICD-10-CM | POA: Diagnosis not present

## 2014-04-05 DIAGNOSIS — F909 Attention-deficit hyperactivity disorder, unspecified type: Secondary | ICD-10-CM | POA: Insufficient documentation

## 2014-04-05 DIAGNOSIS — G43909 Migraine, unspecified, not intractable, without status migrainosus: Secondary | ICD-10-CM | POA: Diagnosis not present

## 2014-04-05 DIAGNOSIS — K088 Other specified disorders of teeth and supporting structures: Secondary | ICD-10-CM | POA: Diagnosis present

## 2014-04-05 LAB — BASIC METABOLIC PANEL
ANION GAP: 7 (ref 5–15)
BUN: 11 mg/dL (ref 6–23)
CALCIUM: 9.5 mg/dL (ref 8.4–10.5)
CHLORIDE: 105 mmol/L (ref 96–112)
CO2: 26 mmol/L (ref 19–32)
CREATININE: 0.77 mg/dL (ref 0.50–1.10)
GFR calc Af Amer: >90 mL/min (ref >90)
GFR calc non Af Amer: >90 mL/min (ref >90)
GLUCOSE: 85 mg/dL (ref 70–99)
Potassium: 3.7 mmol/L (ref 3.5–5.1)
Sodium: 138 mmol/L (ref 135–145)

## 2014-04-05 LAB — CBC WITH DIFFERENTIAL/PLATELET
BASOS ABS: 0.1 10*3/uL (ref 0.0–0.1)
Basophils Relative: 1 % (ref 0–1)
EOS ABS: 0 10*3/uL (ref 0.0–0.7)
EOS PCT: 0 % (ref 0–5)
HEMATOCRIT: 41.4 % (ref 36.0–46.0)
HEMOGLOBIN: 14.2 g/dL (ref 12.0–15.0)
Lymphocytes Relative: 35 % (ref 12–46)
Lymphs Abs: 2.7 10*3/uL (ref 0.7–4.0)
MCH: 30.3 pg (ref 26.0–34.0)
MCHC: 34.3 g/dL (ref 30.0–36.0)
MCV: 88.5 fL (ref 78.0–100.0)
Monocytes Absolute: 0.4 10*3/uL (ref 0.1–1.0)
Monocytes Relative: 6 % (ref 3–12)
Neutro Abs: 4.4 10*3/uL (ref 1.7–7.7)
Neutrophils Relative %: 58 % (ref 43–77)
Platelets: 297 10*3/uL (ref 150–400)
RBC: 4.68 MIL/uL (ref 3.87–5.11)
RDW: 11.4 % — ABNORMAL LOW (ref 11.5–15.5)
WBC: 7.6 10*3/uL (ref 4.0–10.5)

## 2014-04-05 MED ORDER — ONDANSETRON HCL 4 MG/2ML IJ SOLN
4.0000 mg | Freq: Once | INTRAMUSCULAR | Status: AC
  Administered 2014-04-05: 4 mg via INTRAVENOUS
  Filled 2014-04-05: qty 2

## 2014-04-05 MED ORDER — MORPHINE SULFATE 4 MG/ML IJ SOLN
4.0000 mg | Freq: Once | INTRAMUSCULAR | Status: AC
  Administered 2014-04-05: 4 mg via INTRAVENOUS
  Filled 2014-04-05: qty 1

## 2014-04-05 MED ORDER — PROMETHAZINE HCL 25 MG PO TABS: 25.0000 mg | ORAL_TABLET | Freq: Four times a day (QID) | ORAL | Status: DC | PRN

## 2014-04-05 MED ORDER — OXYCODONE-ACETAMINOPHEN 5-325 MG PO TABS: 2.0000 | ORAL_TABLET | ORAL | Status: DC | PRN

## 2014-04-05 MED ORDER — CLINDAMYCIN PHOSPHATE 600 MG/50ML IV SOLN
600.0000 mg | Freq: Once | INTRAVENOUS | Status: AC
  Administered 2014-04-05: 600 mg via INTRAVENOUS
  Filled 2014-04-05: qty 50

## 2014-04-05 MED ORDER — KETOROLAC TROMETHAMINE 30 MG/ML IJ SOLN
30.0000 mg | Freq: Once | INTRAMUSCULAR | Status: AC
  Administered 2014-04-05: 30 mg via INTRAVENOUS
  Filled 2014-04-05: qty 1

## 2014-04-05 MED ORDER — SODIUM CHLORIDE 0.9 % IV BOLUS (SEPSIS)
1000.0000 mL | Freq: Once | INTRAVENOUS | Status: AC
  Administered 2014-04-05: 1000 mL via INTRAVENOUS

## 2014-04-05 MED ORDER — DEXAMETHASONE SODIUM PHOSPHATE 10 MG/ML IJ SOLN
10.0000 mg | Freq: Once | INTRAMUSCULAR | Status: AC
  Administered 2014-04-05: 10 mg via INTRAVENOUS
  Filled 2014-04-05: qty 1

## 2014-04-05 MED ORDER — MORPHINE SULFATE 4 MG/ML IJ SOLN
4.0000 mg | Freq: Once | INTRAMUSCULAR | Status: AC
Start: 2014-04-05 — End: 2014-04-05
  Administered 2014-04-05: 4 mg via INTRAVENOUS
  Filled 2014-04-05: qty 1

## 2014-04-05 NOTE — Discharge Instructions (Signed)
Medication for pain and nausea. Continue antibiotics. Your white count has decreased from the other day. Return here if worse.

## 2014-04-05 NOTE — ED Provider Notes (Signed)
CSN: [id number]     Arrival date & time 04/05/14  1244 History  This chart was scribed for Karen Christen, MD by Peyton Bottoms, ED Scribe. This patient was seen in room APA10/APA10 and the patient's care was started at 1:32 PM.   No chief complaint on file.  The history is provided by the patient. No language interpreter was used.   HPI Comments: Karen Cantu is a 29 y.o. female with a PMHx of IBS, migraine and ADD, who presents to the Emergency Department complaining of severe dental pain that began  5 days ago. Patient was seen by dentist Dr. Bobby Cantu in [place] 6 days ago and states that the dentist cut out one of her wisdom teeth. She was not given pain medication or antibiotic medication. Patient went back to the dentist 4 days ago due to pain and also 3 days ago due to onset of pus from right lower molar. Patient was given Keflex upon third visit to dentist. Patient was also seen by ED 3 days ago due to increased swelling to mouth and neck. She was given IV fluids, and was ordered a diagnostic CT scan. ED diagnosed patient with cellulitis to affected area and was prescribed Clindamycin. Since ED visit, patient has had onset of migraine headaches that last for about 3 hours per episode. She also reports associated subjective fever and abdominal pain onset yesterday. Upon arrival to ED today, patient reports decreased swelling to mouth, and constant dental pain. Patient also complains of small amount of vaginal blood on toilet paper earlier today. She is seen by PCP Dr. Ethlyn Cantu at [place].   Past Medical History  Diagnosis Date  . Irritable bowel syndrome   . Migraine   . Attention deficit disorder    Past Surgical History  Procedure Laterality Date  . Colonoscopy  04/09/2007    [id number] rectum, colon, terminal ileum   Family History  Problem Relation Age of Onset  . Anxiety disorder Mother   . Anxiety disorder Father   . Anxiety disorder Brother    History  Substance Use Topics   . Smoking status: Former Research scientist (life sciences)  . Smokeless tobacco: Never Used  . Alcohol Use: No     Comment: occ.   OB History    No data available     Review of Systems   A complete 10 system review of systems was obtained and all systems are negative except as noted in the HPI and PMH.    Allergies  Penicillins and Sumatriptan  Home Medications   Prior to Admission medications   Medication Sig Start Date End Date Taking? Authorizing Provider  ALPRAZolam [name]) 0.5 MG tablet Take 0.5 mg by mouth 2 (two) times daily.  10/09/12  Yes Historical Provider, MD  Aspirin-Acetaminophen-Caffeine (GOODY HEADACHE PO) Take 1 Package by mouth daily as needed (headache).   Yes Historical Provider, MD  buPROPion (WELLBUTRIN XL) 150 MG 24 hr tablet TAKE 1 TABLET (150 MG TOTAL) BY MOUTH DAILY. 01/27/14  Yes [name] Done, FNP  clindamycin (CLEOCIN) 300 MG capsule Take 1 capsule (300 mg total) by mouth 4 (four) times daily. X 7 days 04/03/14  Yes Veryl Speak, MD  cyanocobalamin (,VITAMIN B-12,) 1000 MCG/ML injection Inject 1,000 mcg into the muscle once a week. On Friday.   Yes Historical Provider, MD  cyclobenzaprine (FLEXERIL) 10 MG tablet Take 10 mg by mouth 3 (three) times daily as needed for muscle spasms.   Yes Historical Provider, MD  HYDROcodone-acetaminophen (NORCO) 10-325 MG per  tablet Take 1 tablet by mouth 2 (two) times daily as needed for moderate pain.   Yes Historical Provider, MD  medroxyPROGESTERone (DEPO-PROVERA) 150 MG/ML injection Inject 1 mL (150 mg total) into the muscle every 3 (three) months. 12/02/13  Yes [name] Done, FNP  NEXIUM 40 MG capsule Take 40 mg by mouth 2 (two) times daily before a meal.  11/11/13  Yes Historical Provider, MD  rizatriptan (MAXALT) 10 MG tablet Take 1 tablet by mouth daily as needed for migraine.  03/10/14  Yes Historical Provider, MD  tiZANidine (ZANAFLEX) 4 MG tablet Take 4 mg by mouth daily as needed for muscle spasms.  09/25/12  Yes Historical  Provider, MD  HYDROcodone-acetaminophen (NORCO) 5-325 MG per tablet Take 1-2 tablets by mouth every 6 (six) hours as needed. Patient not taking: Reported on 04/05/2014 04/03/14   Veryl Speak, MD  lisdexamfetamine (VYVANSE) 20 MG capsule Take 1 capsule (20 mg total) by mouth daily. Patient not taking: Reported on 04/05/2014 12/27/13   [name] Done, FNP  oxyCODONE-acetaminophen (PERCOCET) 5-325 MG per tablet Take 2 tablets by mouth every 4 (four) hours as needed. 04/05/14   Karen Christen, MD  promethazine (PHENERGAN) 25 MG tablet Take 1 tablet (25 mg total) by mouth every 6 (six) hours as needed. 04/05/14   Karen Christen, MD  trichloroacetic acid 80 % LIQD Apply 1 application topically once. Patient not taking: Reported on 04/05/2014 11/18/13   [name], MD   Triage Vitals: BP 112/81 mmHg  Pulse 85  Temp(Src) 98.9 F (37.2 C) (Oral)  Resp 22  Ht 5\' 5"  (1.651 m)  Wt 136 lb (61.689 kg)  BMI 22.63 kg/m2  SpO2 100%  Physical Exam  Constitutional: She is oriented to person, place, and time. She appears well-developed and well-nourished.  HENT:  Head: Normocephalic and atraumatic.  Pt complains of pain to right lower molar. Difficult to visualize because pt could only open mouth small amount. Minimal swelling to right cheek and minimal amount to right neck.  Eyes: Conjunctivae and EOM are normal. Pupils are equal, round, and reactive to light.  Neck: Normal range of motion. Neck supple.  Cardiovascular: Normal rate and regular rhythm.   Pulmonary/Chest: Effort normal and breath sounds normal.  Abdominal: Soft. Bowel sounds are normal.  Musculoskeletal: Normal range of motion.  Neurological: She is alert and oriented to person, place, and time.  Skin: Skin is warm and dry.  Psychiatric: She has a normal mood and affect. Her behavior is normal.  Nursing note and vitals reviewed.  ED Course  Procedures (including critical care time)  DIAGNOSTIC STUDIES: Oxygen Saturation is 100% on RA,  normal by my interpretation.    COORDINATION OF CARE: 1:39 PM- Discussed plans to order diagnostic lab work. Will give patient IV fluids and pain medications. Pt advised of plan for treatment and pt agrees.  Labs Review Labs Reviewed  CBC WITH DIFFERENTIAL/PLATELET - Abnormal; Notable for the following:    RDW 11.4 (*)    All other components within normal limits  BASIC METABOLIC PANEL    Imaging Review No results found.   EKG Interpretation None     MDM   Final diagnoses:  Toothache  Facial cellulitis    Patient is nontoxic-appearing. White count has normalized. She feels better after IV fluids, IV pain management. Discharge medications Percocet and Phenergan 25 mg. Will follow-up with dentist  I personally performed the services described in this documentation, which was scribed in my presence. The recorded information has  been reviewed and is accurate.   Karen Christen, MD 04/05/14 1710

## 2014-04-05 NOTE — ED Notes (Addendum)
Wisdom tooth was cut out Monday. Here Wedensday. Pt has been no antibiotics for cellulitis. Family states she has been having severe migraines and no appetite. Was told if Pain and swelling continued to come back to the ED. Also states pressure to abdomen and blood in the urine. Pt states last BM was 7 days ago.

## 2014-05-23 ENCOUNTER — Other Ambulatory Visit: Payer: Self-pay | Admitting: Nurse Practitioner

## 2014-05-26 ENCOUNTER — Telehealth: Payer: Self-pay | Admitting: Nurse Practitioner

## 2014-05-26 ENCOUNTER — Other Ambulatory Visit: Payer: Self-pay | Admitting: Nurse Practitioner

## 2014-05-26 MED ORDER — MEDROXYPROGESTERONE ACETATE 150 MG/ML IM SUSP: 150.0000 mg | INTRAMUSCULAR | Status: DC

## 2014-05-26 NOTE — Telephone Encounter (Signed)
Depo provera rx sent to pharmacy

## 2014-05-26 NOTE — Telephone Encounter (Signed)
Pt hasn't been seen by a provider since the end of 2014 in our office. Ok to refill depo or does pt ntbs, please advise.

## 2014-05-27 ENCOUNTER — Ambulatory Visit (INDEPENDENT_AMBULATORY_CARE_PROVIDER_SITE_OTHER): Payer: BLUE CROSS/BLUE SHIELD | Admitting: *Deleted

## 2014-05-27 DIAGNOSIS — Z3049 Encounter for surveillance of other contraceptives: Secondary | ICD-10-CM | POA: Diagnosis not present

## 2014-05-27 DIAGNOSIS — Z3042 Encounter for surveillance of injectable contraceptive: Secondary | ICD-10-CM

## 2014-05-27 MED ORDER — MEDROXYPROGESTERONE ACETATE 150 MG/ML IM SUSP
150.0000 mg | INTRAMUSCULAR | Status: AC
  Administered 2014-05-27 – 2015-02-10 (×4): 150 mg via INTRAMUSCULAR

## 2014-05-27 NOTE — Patient Instructions (Signed)

## 2014-05-27 NOTE — Progress Notes (Signed)
Depo provera given and tolerated well.

## 2014-06-02 ENCOUNTER — Ambulatory Visit: Payer: BLUE CROSS/BLUE SHIELD | Admitting: Nurse Practitioner

## 2014-08-18 ENCOUNTER — Other Ambulatory Visit: Payer: Self-pay | Admitting: Nurse Practitioner

## 2014-08-18 ENCOUNTER — Ambulatory Visit (INDEPENDENT_AMBULATORY_CARE_PROVIDER_SITE_OTHER): Payer: BLUE CROSS/BLUE SHIELD | Admitting: *Deleted

## 2014-08-18 DIAGNOSIS — Z3049 Encounter for surveillance of other contraceptives: Secondary | ICD-10-CM | POA: Diagnosis not present

## 2014-08-18 DIAGNOSIS — Z3042 Encounter for surveillance of injectable contraceptive: Secondary | ICD-10-CM

## 2014-08-18 NOTE — Progress Notes (Signed)
Patient tolerated well. She has no complaints. Return in 11-13 weeks for injection.

## 2014-11-07 ENCOUNTER — Other Ambulatory Visit: Payer: Self-pay

## 2014-11-07 ENCOUNTER — Encounter: Payer: Self-pay | Admitting: Gastroenterology

## 2014-11-07 ENCOUNTER — Telehealth: Payer: Self-pay | Admitting: Gastroenterology

## 2014-11-07 ENCOUNTER — Ambulatory Visit (INDEPENDENT_AMBULATORY_CARE_PROVIDER_SITE_OTHER): Payer: BLUE CROSS/BLUE SHIELD | Admitting: Gastroenterology

## 2014-11-07 VITALS — BP 107/75 | HR 79 | Temp 97.5°F | Ht 65.0 in | Wt 133.6 lb

## 2014-11-07 DIAGNOSIS — R1314 Dysphagia, pharyngoesophageal phase: Secondary | ICD-10-CM

## 2014-11-07 DIAGNOSIS — K5900 Constipation, unspecified: Secondary | ICD-10-CM | POA: Diagnosis not present

## 2014-11-07 DIAGNOSIS — K219 Gastro-esophageal reflux disease without esophagitis: Secondary | ICD-10-CM | POA: Diagnosis not present

## 2014-11-07 MED ORDER — LINACLOTIDE 290 MCG PO CAPS: 290.0000 ug | ORAL_CAPSULE | Freq: Every day | ORAL | Status: DC

## 2014-11-07 MED ORDER — DEXLANSOPRAZOLE 60 MG PO CPDR: 60.0000 mg | DELAYED_RELEASE_CAPSULE | Freq: Every day | ORAL | Status: DC

## 2014-11-07 NOTE — Assessment & Plan Note (Signed)
29 year old female with worsening GERD, regurgitation, globus sensation, solid food and pill dysphagia, with failure of multiple PPIs. No trial of Dexilant yet. Last EGD in 2012 normal. Continues to take Goody powders occasionally for headaches. Instructed to avoid. Query uncontrolled GERD, possible web, ring, stricture as culprit.   Proceed with upper endoscopy and dilation in the near future with Dr. Gala Romney. The risks, benefits, and alternatives have been discussed in detail with patient. They have stated understanding and desire to proceed.  Dexilant samples and prescription sent to pharmacy

## 2014-11-07 NOTE — Telephone Encounter (Signed)
Please arrange follow-up with me in about 3 months.

## 2014-11-07 NOTE — Patient Instructions (Signed)
For reflux: start taking Dexilant once daily. I have provided samples. Your insurance may make [place] do a prior authorization, so we will work on that if it does.  For constipation: start taking Linzess 290 mcg one capsule each morning, 30 minutes before breakfast. This takes the place of all the other agents you have used. I have provided samples and copay card.  We have scheduled you for an upper endoscopy and dilation with Dr. Gala Romney.

## 2014-11-07 NOTE — Assessment & Plan Note (Signed)
Dilation as appropriate.  

## 2014-11-07 NOTE — Progress Notes (Signed)
Referring Daeton Kluth: Chevis Pretty, * Primary Care Physician:  Rocky Morel, MD Primary GI: Dr. Gala Romney   Chief Complaint  Patient presents with  . Dysphagia  . Gastrophageal Reflux  . Constipation    HPI:   Karen Cantu is a 29 y.o. female presenting today with a history of GERD, chronic abdominal pain and constipation. Last EGD in 2012 normal. Colonoscopy normal in 2009. Hasn't been seen in several years.   Has tried Nexium, Protonix, OTC agents. Has globus sensation. Severe burning in esophagus, doesn't want to eat. Baking soda and water with some relief. Gets to point she has to throw up. Points to cervical esophagus. Pill dysphagia. Solid food dysphagia. Regurgitation. Burning in esophagus. Feels like she can't take a deep breath when it's going on. After vomiting it is relieved for awhile. Nexium OTC without relief. Occasional Goody powders for headaches. Associated nausea.   Constipation worse. Lots of fiber, stool softeners do not work anymore. Failed laxatives. Starts sweating, feels freezing cold, has significant abdominal pain intermittently. Has had to manually disimpact herself at times. Pain usually mid abdomen, can be diffuse.   Past Medical History  Diagnosis Date  . Irritable bowel syndrome   . Migraine   . Attention deficit disorder   . GERD (gastroesophageal reflux disease)   . Syringomyelia     Past Surgical History  Procedure Laterality Date  . Colonoscopy  04/09/2007    [id number] rectum, colon, terminal ileum  . Esophagogastroduodenoscopy  2012    Dr. Gala Romney: normal     Current Outpatient Prescriptions  Medication Sig Dispense Refill  . ALPRAZolam (XANAX) 0.5 MG tablet Take 0.5 mg by mouth 2 (two) times daily.     . Aspirin-Acetaminophen-Caffeine (GOODY HEADACHE PO) Take 1 Package by mouth daily as needed (headache).    . cyanocobalamin (,VITAMIN B-12,) 1000 MCG/ML injection Inject 1,000 mcg into the muscle once a week. On  Friday.    . cyclobenzaprine (FLEXERIL) 10 MG tablet Take 10 mg by mouth 3 (three) times daily as needed for muscle spasms.    Marland Kitchen HYDROcodone-acetaminophen (NORCO) 10-325 MG per tablet Take 1 tablet by mouth 2 (two) times daily as needed for moderate pain.    . medroxyPROGESTERone (DEPO-PROVERA) 150 MG/ML injection Inject 1 mL (150 mg total) into the muscle every 3 (three) months. 1 mL 1   Current Facility-Administered Medications  Medication Dose Route Frequency Cherrie Franca Last Rate Last Dose  . medroxyPROGESTERone (DEPO-PROVERA) injection 150 mg  150 mg Intramuscular Q90 days [name] Done, FNP   150 mg at 08/18/14 1518    Allergies as of 11/07/2014 - Review Complete 11/07/2014  Allergen Reaction Noted  . Penicillins    . Sumatriptan      Family History  Problem Relation Age of Onset  . Anxiety disorder Mother   . Anxiety disorder Father   . Anxiety disorder Brother   . Colon polyps Mother   . Colon cancer Maternal Grandmother     Social History   Social History  . Marital Status: Single    Spouse Name: N/A  . Number of Children: N/A  . Years of Education: N/A   Occupational History  . [place] History Main Topics  . Smoking status: Former Research scientist (life sciences)  . Smokeless tobacco: Never Used  . Alcohol Use: No     Comment: occ.  . Drug Use: No  . Sexual Activity: Yes    Birth Control/ Protection:  Injection   Other Topics Concern  . None   Social History Narrative    Review of Systems: Gen: see HPI CV: Denies chest pain, palpitations, syncope, peripheral edema, and claudication. Resp: +DOE  GI: see HPI  Derm: Denies rash, itching, dry skin MSK: chronic back pain  Psych: +depression/anxiety  Heme: Denies bruising, bleeding, and enlarged lymph nodes.  Physical Exam: BP 107/75 mmHg  Pulse 79  Temp(Src) 97.5 F (36.4 C)  Ht 5\' 5"  (1.651 m)  Wt 133 lb 9.6 oz (60.601 kg)  BMI 22.23 kg/m2  LMP  General:   Alert and oriented. No  distress noted. Pleasant and cooperative.  Head:  Normocephalic and atraumatic. Eyes:  Conjuctiva clear without scleral icterus. Mouth:  Oral mucosa pink and moist. Good dentition. No lesions. Heart:  S1, S2 present without murmurs, rubs, or gallops. Regular rate and rhythm. Abdomen:  +BS, soft, non-tender and non-distended. No rebound or guarding. No HSM or masses noted. Msk:  Symmetrical without gross deformities. Normal posture. Extremities:  Without edema. Neurologic:  Alert and  oriented x4;  grossly normal neurologically. Skin:  Intact without significant lesions or rashes. Psych:  Alert and cooperative. Normal mood and affect.

## 2014-11-07 NOTE — Assessment & Plan Note (Signed)
Failure of multiple OTC agents. Needs more aggressive regimen. Linzess 290 mcg prescribed for patient. Samples provided as well. Return in 3 months.

## 2014-11-07 NOTE — Telephone Encounter (Signed)
OV MADE AND LETTER MAILED °

## 2014-11-10 NOTE — Progress Notes (Signed)
cc'ed to pcp °

## 2014-11-17 ENCOUNTER — Ambulatory Visit: Payer: BLUE CROSS/BLUE SHIELD | Admitting: Gastroenterology

## 2014-11-20 ENCOUNTER — Other Ambulatory Visit: Payer: Self-pay | Admitting: Nurse Practitioner

## 2014-11-20 ENCOUNTER — Ambulatory Visit: Payer: BLUE CROSS/BLUE SHIELD

## 2014-11-21 ENCOUNTER — Ambulatory Visit (INDEPENDENT_AMBULATORY_CARE_PROVIDER_SITE_OTHER): Payer: BLUE CROSS/BLUE SHIELD | Admitting: *Deleted

## 2014-11-21 ENCOUNTER — Other Ambulatory Visit: Payer: Self-pay | Admitting: *Deleted

## 2014-11-21 DIAGNOSIS — Z3049 Encounter for surveillance of other contraceptives: Secondary | ICD-10-CM | POA: Diagnosis not present

## 2014-11-21 DIAGNOSIS — Z3042 Encounter for surveillance of injectable contraceptive: Secondary | ICD-10-CM

## 2014-11-21 MED ORDER — MEDROXYPROGESTERONE ACETATE 150 MG/ML IM SUSP: 150.0000 mg | INTRAMUSCULAR | Status: DC

## 2014-11-26 ENCOUNTER — Encounter (HOSPITAL_COMMUNITY)
Admission: RE | Disposition: A | Discharge status: Home Health | Payer: Self-pay | Source: Ambulatory Visit | Attending: Internal Medicine

## 2014-11-26 ENCOUNTER — Encounter (HOSPITAL_COMMUNITY): Payer: Self-pay

## 2014-11-26 ENCOUNTER — Ambulatory Visit (HOSPITAL_COMMUNITY)
Admission: RE | Admit: 2014-11-26 | Discharge: 2014-11-26 | Disposition: A | Discharge status: Home Health | Payer: BLUE CROSS/BLUE SHIELD | Source: Ambulatory Visit | Attending: Internal Medicine | Admitting: Internal Medicine

## 2014-11-26 DIAGNOSIS — G8929 Other chronic pain: Secondary | ICD-10-CM | POA: Insufficient documentation

## 2014-11-26 DIAGNOSIS — R1314 Dysphagia, pharyngoesophageal phase: Secondary | ICD-10-CM

## 2014-11-26 DIAGNOSIS — R1310 Dysphagia, unspecified: Secondary | ICD-10-CM | POA: Insufficient documentation

## 2014-11-26 DIAGNOSIS — K5900 Constipation, unspecified: Secondary | ICD-10-CM | POA: Insufficient documentation

## 2014-11-26 DIAGNOSIS — K210 Gastro-esophageal reflux disease with esophagitis: Secondary | ICD-10-CM | POA: Insufficient documentation

## 2014-11-26 DIAGNOSIS — K219 Gastro-esophageal reflux disease without esophagitis: Secondary | ICD-10-CM | POA: Insufficient documentation

## 2014-11-26 DIAGNOSIS — K449 Diaphragmatic hernia without obstruction or gangrene: Secondary | ICD-10-CM | POA: Insufficient documentation

## 2014-11-26 DIAGNOSIS — R1084 Generalized abdominal pain: Secondary | ICD-10-CM | POA: Diagnosis not present

## 2014-11-26 HISTORY — PX: ESOPHAGEAL DILATION: SHX303

## 2014-11-26 HISTORY — PX: ESOPHAGOGASTRODUODENOSCOPY: SHX5428

## 2014-11-26 SURGERY — EGD (ESOPHAGOGASTRODUODENOSCOPY)
Anesthesia: Moderate Sedation

## 2014-11-26 MED ORDER — SIMETHICONE 40 MG/0.6ML PO SUSP
ORAL | Status: DC | PRN
  Administered 2014-11-26: 09:00:00

## 2014-11-26 MED ORDER — ONDANSETRON HCL 4 MG/2ML IJ SOLN
INTRAMUSCULAR | Status: DC | PRN
  Administered 2014-11-26: 4 mg via INTRAVENOUS

## 2014-11-26 MED ORDER — MEPERIDINE HCL 100 MG/ML IJ SOLN
INTRAMUSCULAR | Status: DC | PRN
  Administered 2014-11-26: 50 mg via INTRAVENOUS
  Administered 2014-11-26: 25 mg via INTRAVENOUS
  Administered 2014-11-26: 50 mg via INTRAVENOUS
  Administered 2014-11-26 (×2): 25 mg via INTRAVENOUS

## 2014-11-26 MED ORDER — ONDANSETRON HCL 4 MG/2ML IJ SOLN
INTRAMUSCULAR | Status: AC
  Filled 2014-11-26: qty 2

## 2014-11-26 MED ORDER — MEPERIDINE HCL 100 MG/ML IJ SOLN
INTRAMUSCULAR | Status: AC
  Filled 2014-11-26: qty 2

## 2014-11-26 MED ORDER — LIDOCAINE VISCOUS 2 % MT SOLN
OROMUCOSAL | Status: DC | PRN
  Administered 2014-11-26: 3 mL via OROMUCOSAL

## 2014-11-26 MED ORDER — LIDOCAINE VISCOUS 2 % MT SOLN
OROMUCOSAL | Status: AC
  Filled 2014-11-26: qty 15

## 2014-11-26 MED ORDER — SODIUM CHLORIDE 0.9 % IV SOLN
INTRAVENOUS | Status: DC
  Administered 2014-11-26: 09:00:00 via INTRAVENOUS

## 2014-11-26 MED ORDER — MIDAZOLAM HCL 5 MG/5ML IJ SOLN
INTRAMUSCULAR | Status: AC
  Filled 2014-11-26: qty 10

## 2014-11-26 MED ORDER — MIDAZOLAM HCL 5 MG/5ML IJ SOLN
INTRAMUSCULAR | Status: DC | PRN
  Administered 2014-11-26 (×2): 1 mg via INTRAVENOUS
  Administered 2014-11-26: 2 mg via INTRAVENOUS
  Administered 2014-11-26: 1 mg via INTRAVENOUS
  Administered 2014-11-26: 2 mg via INTRAVENOUS
  Administered 2014-11-26: 1 mg via INTRAVENOUS
  Administered 2014-11-26: 2 mg via INTRAVENOUS

## 2014-11-26 NOTE — Op Note (Signed)
[facility] [address] [place], [address]   ENDOSCOPY PROCEDURE REPORT  PATIENT: Karen, Cantu  MR#: [id number] BIRTHDATE: 1986-02-06 , 29  yrs. old GENDER: female ENDOSCOPIST: R.  Garfield Cornea, MD FACP FACG REFERRED BY:  Micheline Rough, M.D. PROCEDURE DATE:  November 27, 2014 PROCEDURE:  EGD, diagnostic w/ biopsy and Maloney dilation of esophagus INDICATIONS:  dysphagia/GERD?"recent improvement with Dexilant but not other PPI's. MEDICATIONS: Versed 10 mg IV and Demerol 175 mg IV in divided doses. Zofran 4 mg IV.  Xylocaine gel orally. ASA CLASS:      Class II  CONSENT: The risks, benefits, limitations, alternatives and imponderables have been discussed.  The potential for biopsy, esophogeal dilation, etc. have also been reviewed.  Questions have been answered.  All parties agreeable.  Please see the history and physical in the medical record for more information.  DESCRIPTION OF PROCEDURE: After the risks benefits and alternatives of the procedure were thoroughly explained, informed consent was obtained.  The EG-[id number] ([id number]) endoscope was introduced through the mouth and advanced to the second portion of the duodenum , The instrument was slowly withdrawn as the mucosa was fully examined. Estimated blood loss is zero unless otherwise noted in this procedure report.    Normal-appearing tubular esophagus.  Stomach empty.  2 cm hiatal hernia.  Normal-appearing gastric mucosa.  Patent pylorus. Normal-appearing first and second portion of the duodenum.  A 54 French Maloney dilator was passed to full insertion easily.  A look back reveal no apparent complications related to this maneuver.  Subsequently, biopsies of the mid and distal esophagus were taken to evaluate for eosinophilic esophagitis.         The scope was then withdrawn from the patient and the procedure completed.  COMPLICATIONS: There were no immediate complications. EBL 3  mL ENDOSCOPIC IMPRESSION: Hiatal hernia; otherwise normal EGD. Status post Maloney dilation and esophageal biopsy. Patient reports significant improvement in symptoms recently with a course of Dexilant samples.  RECOMMENDATIONS: Continue Dexilant 60 mg daily. Follow-up pathology. Further recommendations to follow.  REPEAT EXAM:  eSigned:  R. Garfield Cornea, MD Rosalita Chessman [facility] Nov 27, 2014 10:15 AM    CC:  CPT CODES: ICD CODES:  The ICD and CPT codes recommended by this software are interpretations from the data that the clinical staff has captured with the software.  The verification of the translation of this report to the ICD and CPT codes and modifiers is the sole responsibility of the health care institution and practicing physician where this report was generated.  [employer] will not be held responsible for the validity of the ICD and CPT codes included on this report.  AMA assumes no liability for data contained or not contained herein. CPT is a Designer, television/film set of the [employer].  PATIENT NAME:  Karen, Cantu MR#: [id number]

## 2014-11-26 NOTE — Discharge Instructions (Signed)
EGD Discharge instructions Please read the instructions outlined below and refer to this sheet in the next few weeks. These discharge instructions provide you with general information on caring for yourself after you leave the hospital. Your doctor may also give you specific instructions. While your treatment has been planned according to the most current medical practices available, unavoidable complications occasionally occur. If you have any problems or questions after discharge, please call your doctor. ACTIVITY  You may resume your regular activity but move at a slower pace for the next 24 hours.   Take frequent rest periods for the next 24 hours.   Walking will help expel (get rid of) the air and reduce the bloated feeling in your abdomen.   No driving for 24 hours (because of the anesthesia (medicine) used during the test).   You may shower.   Do not sign any important legal documents or operate any machinery for 24 hours (because of the anesthesia used during the test).  NUTRITION  Drink plenty of fluids.   You may resume your normal diet.   Begin with a light meal and progress to your normal diet.   Avoid alcoholic beverages for 24 hours or as instructed by your caregiver.  MEDICATIONS  You may resume your normal medications unless your caregiver tells you otherwise.  WHAT YOU CAN EXPECT TODAY  You may experience abdominal discomfort such as a feeling of fullness or gas pains.  FOLLOW-UP  Your doctor will discuss the results of your test with you.  SEEK IMMEDIATE MEDICAL ATTENTION IF ANY OF THE FOLLOWING OCCUR:  Excessive nausea (feeling sick to your stomach) and/or vomiting.   Severe abdominal pain and distention (swelling).   Trouble swallowing.   Temperature over 101 F (37.8 C).   Rectal bleeding or vomiting of blood.     Continue Dexilant 60 mg daily  Further recommendations to follow pending review of pathology report.

## 2014-11-26 NOTE — H&P (View-Only) (Signed)
Referring Kamauri Denardo: Chevis Pretty, * Primary Care Physician:  Rocky Morel, MD Primary GI: Dr. Gala Romney   Chief Complaint  Patient presents with  . Dysphagia  . Gastrophageal Reflux  . Constipation    HPI:   Karen Cantu is a 29 y.o. female presenting today with a history of GERD, chronic abdominal pain and constipation. Last EGD in 2012 normal. Colonoscopy normal in 2009. Hasn't been seen in several years.   Has tried Nexium, Protonix, OTC agents. Has globus sensation. Severe burning in esophagus, doesn't want to eat. Baking soda and water with some relief. Gets to point she has to throw up. Points to cervical esophagus. Pill dysphagia. Solid food dysphagia. Regurgitation. Burning in esophagus. Feels like she can't take a deep breath when it's going on. After vomiting it is relieved for awhile. Nexium OTC without relief. Occasional Goody powders for headaches. Associated nausea.   Constipation worse. Lots of fiber, stool softeners do not work anymore. Failed laxatives. Starts sweating, feels freezing cold, has significant abdominal pain intermittently. Has had to manually disimpact herself at times. Pain usually mid abdomen, can be diffuse.   Past Medical History  Diagnosis Date  . Irritable bowel syndrome   . Migraine   . Attention deficit disorder   . GERD (gastroesophageal reflux disease)   . Syringomyelia     Past Surgical History  Procedure Laterality Date  . Colonoscopy  04/09/2007    [id number] rectum, colon, terminal ileum  . Esophagogastroduodenoscopy  2012    Dr. Gala Romney: normal     Current Outpatient Prescriptions  Medication Sig Dispense Refill  . ALPRAZolam (XANAX) 0.5 MG tablet Take 0.5 mg by mouth 2 (two) times daily.     . Aspirin-Acetaminophen-Caffeine (GOODY HEADACHE PO) Take 1 Package by mouth daily as needed (headache).    . cyanocobalamin (,VITAMIN B-12,) 1000 MCG/ML injection Inject 1,000 mcg into the muscle once a week. On  Friday.    . cyclobenzaprine (FLEXERIL) 10 MG tablet Take 10 mg by mouth 3 (three) times daily as needed for muscle spasms.    Marland Kitchen HYDROcodone-acetaminophen (NORCO) 10-325 MG per tablet Take 1 tablet by mouth 2 (two) times daily as needed for moderate pain.    . medroxyPROGESTERone (DEPO-PROVERA) 150 MG/ML injection Inject 1 mL (150 mg total) into the muscle every 3 (three) months. 1 mL 1   Current Facility-Administered Medications  Medication Dose Route Frequency Trica Usery Last Rate Last Dose  . medroxyPROGESTERone (DEPO-PROVERA) injection 150 mg  150 mg Intramuscular Q90 days [name] Done, FNP   150 mg at 08/18/14 1518    Allergies as of 11/07/2014 - Review Complete 11/07/2014  Allergen Reaction Noted  . Penicillins    . Sumatriptan      Family History  Problem Relation Age of Onset  . Anxiety disorder Mother   . Anxiety disorder Father   . Anxiety disorder Brother   . Colon polyps Mother   . Colon cancer Maternal Grandmother     Social History   Social History  . Marital Status: Single    Spouse Name: N/A  . Number of Children: N/A  . Years of Education: N/A   Occupational History  . [place] History Main Topics  . Smoking status: Former Research scientist (life sciences)  . Smokeless tobacco: Never Used  . Alcohol Use: No     Comment: occ.  . Drug Use: No  . Sexual Activity: Yes    Birth Control/ Protection:  Injection   Other Topics Concern  . None   Social History Narrative    Review of Systems: Gen: see HPI CV: Denies chest pain, palpitations, syncope, peripheral edema, and claudication. Resp: +DOE  GI: see HPI  Derm: Denies rash, itching, dry skin MSK: chronic back pain  Psych: +depression/anxiety  Heme: Denies bruising, bleeding, and enlarged lymph nodes.  Physical Exam: BP 107/75 mmHg  Pulse 79  Temp(Src) 97.5 F (36.4 C)  Ht 5\' 5"  (1.651 m)  Wt 133 lb 9.6 oz (60.601 kg)  BMI 22.23 kg/m2  LMP  General:   Alert and oriented. No  distress noted. Pleasant and cooperative.  Head:  Normocephalic and atraumatic. Eyes:  Conjuctiva clear without scleral icterus. Mouth:  Oral mucosa pink and moist. Good dentition. No lesions. Heart:  S1, S2 present without murmurs, rubs, or gallops. Regular rate and rhythm. Abdomen:  +BS, soft, non-tender and non-distended. No rebound or guarding. No HSM or masses noted. Msk:  Symmetrical without gross deformities. Normal posture. Extremities:  Without edema. Neurologic:  Alert and  oriented x4;  grossly normal neurologically. Skin:  Intact without significant lesions or rashes. Psych:  Alert and cooperative. Normal mood and affect.

## 2014-11-26 NOTE — Interval H&P Note (Signed)
History and Physical Interval Note:  11/26/2014 9:19 AM  Karen Cantu  has presented today for surgery, with the diagnosis of DYSPHAGIA  The various methods of treatment have been discussed with the patient and family. After consideration of risks, benefits and other options for treatment, the patient has consented to  Procedure(s) with comments: ESOPHAGOGASTRODUODENOSCOPY (EGD) (N/A) - 945 - moved to 10/5 @ 9:15 ESOPHAGEAL DILATION (N/A) as a surgical intervention .  The patient's history has been reviewed, patient examined, no change in status, stable for surgery.  I have reviewed the patient's chart and labs.  Questions were answered to the patient's satisfaction.     [name]  No change aside from the [place] recently has helped her symptoms significantly.Marland Kitchen EGD with potential esophageal dilation as feasible/appropriate, etc. today per plan.The risks, benefits, limitations, alternatives and imponderables have been reviewed with the patient. Potential for esophageal dilation, biopsy, etc. have also been reviewed.  Questions have been answered. All parties agreeable.

## 2014-11-28 ENCOUNTER — Encounter: Payer: Self-pay | Admitting: Internal Medicine

## 2014-12-02 ENCOUNTER — Encounter (HOSPITAL_COMMUNITY): Payer: Self-pay | Admitting: Internal Medicine

## 2014-12-02 ENCOUNTER — Telehealth: Payer: Self-pay

## 2014-12-02 NOTE — Telephone Encounter (Signed)
Letter mailed to the pt. Please schedule ov. 

## 2014-12-02 NOTE — Telephone Encounter (Signed)
PATIENT HAS APPOINTMENT  °

## 2014-12-02 NOTE — Telephone Encounter (Signed)
Per RMR- Send letter to patient.  Send copy of letter with path to referring provider and PCP.    Would offer a follow-up appointment with [place] in 3 months

## 2014-12-08 ENCOUNTER — Encounter: Payer: Self-pay | Admitting: *Deleted

## 2014-12-08 ENCOUNTER — Other Ambulatory Visit: Payer: BLUE CROSS/BLUE SHIELD | Admitting: Obstetrics and Gynecology

## 2015-02-03 ENCOUNTER — Other Ambulatory Visit (HOSPITAL_COMMUNITY)
Admission: RE | Admit: 2015-02-03 | Discharge: 2015-02-03 | Disposition: A | Discharge status: Home / Self Care | Payer: BLUE CROSS/BLUE SHIELD | Source: Ambulatory Visit | Attending: Obstetrics and Gynecology | Admitting: Obstetrics and Gynecology

## 2015-02-03 ENCOUNTER — Encounter: Payer: Self-pay | Admitting: Obstetrics and Gynecology

## 2015-02-03 ENCOUNTER — Ambulatory Visit (INDEPENDENT_AMBULATORY_CARE_PROVIDER_SITE_OTHER): Payer: BLUE CROSS/BLUE SHIELD | Admitting: Obstetrics and Gynecology

## 2015-02-03 VITALS — BP 100/60 | Ht 65.0 in | Wt 140.0 lb

## 2015-02-03 DIAGNOSIS — Z0000 Encounter for general adult medical examination without abnormal findings: Secondary | ICD-10-CM

## 2015-02-03 DIAGNOSIS — Z01419 Encounter for gynecological examination (general) (routine) without abnormal findings: Secondary | ICD-10-CM | POA: Diagnosis present

## 2015-02-03 DIAGNOSIS — Z1151 Encounter for screening for human papillomavirus (HPV): Secondary | ICD-10-CM | POA: Insufficient documentation

## 2015-02-03 NOTE — Progress Notes (Signed)
Patient ID: Karen Cantu, female   DOB: 1985/07/05, 29 y.o.   MRN: [id number] Pt here today for annual exam. Pt denies any problems or concerns at this time.

## 2015-02-03 NOTE — Progress Notes (Signed)
Patient ID: Karen Cantu, female   DOB: 03-13-1985, 29 y.o.   MRN: [id number]  Assessment:  Annual Gyn Exam   Plan:  1. pap smear done, next pap due 3 yr 2. return annually or prn 3    Annual mammogram advised Subjective:  Karen Cantu is a 29 y.o. female No obstetric history on file. who presents for annual exam. No LMP recorded. Patient has had an injection. The patient has complaints today of dyspareunia, has uterine retroversion  The following portions of the patient's history were reviewed and updated as appropriate: allergies, current medications, past family history, past medical history, past social history, past surgical history and problem list. Past Medical History  Diagnosis Date  . Irritable bowel syndrome   . Migraine   . Attention deficit disorder   . GERD (gastroesophageal reflux disease)   . Syringomyelia [facility])     Past Surgical History  Procedure Laterality Date  . Colonoscopy  04/09/2007    [id number] rectum, colon, terminal ileum  . Esophagogastroduodenoscopy  2012    Dr. Gala Romney: normal   . Esophagogastroduodenoscopy N/A 11/26/2014    Procedure: ESOPHAGOGASTRODUODENOSCOPY (EGD);  Surgeon: Daneil Dolin, MD;  [facility];  Service: Endoscopy;  Laterality: N/A;  945 - moved to 10/5 @ 9:15  . Esophageal dilation N/A 11/26/2014    Procedure: ESOPHAGEAL DILATION;  Surgeon: Daneil Dolin, MD;  [facility];  Service: Endoscopy;  Laterality: N/A;     Current outpatient prescriptions:  .  ALPRAZolam (XANAX) 0.5 MG tablet, Take 0.5 mg by mouth 2 (two) times daily. , Disp: , Rfl:  .  Aspirin-Acetaminophen-Caffeine (GOODY HEADACHE PO), Take 1 Package by mouth daily as needed (headache)., Disp: , Rfl:  .  cyanocobalamin (,VITAMIN B-12,) 1000 MCG/ML injection, Inject 1,000 mcg into the muscle once a week. On Friday., Disp: , Rfl:  .  cyclobenzaprine (FLEXERIL) 10 MG tablet, Take 10 mg by mouth 3 (three) times daily as needed for muscle  spasms., Disp: , Rfl:  .  dexlansoprazole (DEXILANT) 60 MG capsule, Take 1 capsule (60 mg total) by mouth daily., Disp: 90 capsule, Rfl: 3 .  HYDROcodone-acetaminophen (NORCO) 10-325 MG per tablet, Take 1 tablet by mouth 2 (two) times daily as needed for moderate pain., Disp: , Rfl:  .  Linaclotide (LINZESS) 290 MCG CAPS capsule, Take 1 capsule (290 mcg total) by mouth daily. 30 minutes before breakfast, Disp: 30 capsule, Rfl: 5 .  medroxyPROGESTERone (DEPO-PROVERA) 150 MG/ML injection, Inject 1 mL (150 mg total) into the muscle every 3 (three) months., Disp: 1 mL, Rfl: 1  Current facility-administered medications:  .  medroxyPROGESTERone (DEPO-PROVERA) injection 150 mg, 150 mg, Intramuscular, Q90 days, Mary-Margaret Martin, FNP, 150 mg at 11/21/14 1436  Review of Systems Constitutional: negative Gastrointestinal: negative Genitourinary: dyspareunia deep thrust desires pregnancy in future, discussed age factors for pregnancy  Objective:  There were no vitals taken for this visit.   BMI: There is no weight on file to calculate BMI.  General Appearance: Alert, appropriate appearance for age. No acute distress HEENT: Grossly normal Neck / Thyroid:  Cardiovascular: RRR; normal S1, S2, no murmur Lungs: CTA bilaterally Back: No CVAT Breast Exam: No dimpling, nipple retraction or discharge. No masses or nodes. and No masses or nodes.No dimpling, nipple retraction or discharge. Gastrointestinal: Soft, non-tender, no masses or organomegaly Pelvic Exam: Vulva and vagina appear normal. Bimanual exam reveals normal uterus and adnexa.retroverted uterus  Rectovaginal: not indicated Lymphatic Exam: Non-palpable nodes in neck,  clavicular, axillary, or inguinal regions Skin: no rash or abnormalities Neurologic: Normal gait and speech, no tremor  Psychiatric: Alert and oriented, appropriate affect.  Urinalysis:Not done  Mallory Shirk. MD Pgr [phone] 4:17 PM

## 2015-02-06 ENCOUNTER — Ambulatory Visit: Payer: BLUE CROSS/BLUE SHIELD | Admitting: Gastroenterology

## 2015-02-06 LAB — CYTOLOGY - PAP

## 2015-02-10 ENCOUNTER — Ambulatory Visit (INDEPENDENT_AMBULATORY_CARE_PROVIDER_SITE_OTHER): Payer: BLUE CROSS/BLUE SHIELD | Admitting: *Deleted

## 2015-02-10 DIAGNOSIS — Z3042 Encounter for surveillance of injectable contraceptive: Secondary | ICD-10-CM

## 2015-02-10 DIAGNOSIS — Z3049 Encounter for surveillance of other contraceptives: Secondary | ICD-10-CM | POA: Diagnosis not present

## 2015-02-10 NOTE — Patient Instructions (Signed)

## 2015-02-10 NOTE — Progress Notes (Signed)
DepoProvera given and patient tolerated well.  

## 2015-04-28 ENCOUNTER — Ambulatory Visit: Payer: BLUE CROSS/BLUE SHIELD

## 2015-04-29 ENCOUNTER — Ambulatory Visit: Payer: BLUE CROSS/BLUE SHIELD

## 2015-04-29 ENCOUNTER — Telehealth: Payer: Self-pay | Admitting: Nurse Practitioner

## 2015-04-29 ENCOUNTER — Other Ambulatory Visit: Payer: Self-pay | Admitting: Nurse Practitioner

## 2015-04-29 MED ORDER — MEDROXYPROGESTERONE ACETATE 150 MG/ML IM SUSP: 150.0000 mg | INTRAMUSCULAR | Status: DC

## 2015-04-29 NOTE — Telephone Encounter (Signed)
done

## 2015-05-12 ENCOUNTER — Ambulatory Visit (INDEPENDENT_AMBULATORY_CARE_PROVIDER_SITE_OTHER): Payer: BLUE CROSS/BLUE SHIELD | Admitting: *Deleted

## 2015-05-12 DIAGNOSIS — Z3049 Encounter for surveillance of other contraceptives: Secondary | ICD-10-CM

## 2015-05-12 DIAGNOSIS — Z3042 Encounter for surveillance of injectable contraceptive: Secondary | ICD-10-CM

## 2015-05-12 MED ORDER — MEDROXYPROGESTERONE ACETATE 150 MG/ML IM SUSP
150.0000 mg | INTRAMUSCULAR | Status: AC
  Administered 2015-05-12 – 2015-11-03 (×3): 150 mg via INTRAMUSCULAR

## 2015-05-12 NOTE — Progress Notes (Signed)
Depo Provera given and patient tolerated well. Patients next injection is due between 6/6-6/20.

## 2015-05-12 NOTE — Patient Instructions (Signed)

## 2015-06-08 ENCOUNTER — Ambulatory Visit: Payer: BLUE CROSS/BLUE SHIELD | Admitting: Obstetrics and Gynecology

## 2015-06-30 DIAGNOSIS — M25531 Pain in right wrist: Secondary | ICD-10-CM | POA: Diagnosis not present

## 2015-06-30 DIAGNOSIS — F909 Attention-deficit hyperactivity disorder, unspecified type: Secondary | ICD-10-CM | POA: Diagnosis not present

## 2015-06-30 DIAGNOSIS — Z1389 Encounter for screening for other disorder: Secondary | ICD-10-CM | POA: Diagnosis not present

## 2015-06-30 DIAGNOSIS — G894 Chronic pain syndrome: Secondary | ICD-10-CM | POA: Diagnosis not present

## 2015-06-30 DIAGNOSIS — Z6824 Body mass index (BMI) 24.0-24.9, adult: Secondary | ICD-10-CM | POA: Diagnosis not present

## 2015-08-11 ENCOUNTER — Ambulatory Visit (INDEPENDENT_AMBULATORY_CARE_PROVIDER_SITE_OTHER): Payer: BLUE CROSS/BLUE SHIELD

## 2015-08-11 DIAGNOSIS — Z3042 Encounter for surveillance of injectable contraceptive: Secondary | ICD-10-CM

## 2015-08-11 DIAGNOSIS — Z3049 Encounter for surveillance of other contraceptives: Secondary | ICD-10-CM

## 2015-09-16 DIAGNOSIS — F909 Attention-deficit hyperactivity disorder, unspecified type: Secondary | ICD-10-CM | POA: Diagnosis not present

## 2015-09-16 DIAGNOSIS — Z6823 Body mass index (BMI) 23.0-23.9, adult: Secondary | ICD-10-CM | POA: Diagnosis not present

## 2015-09-16 DIAGNOSIS — F419 Anxiety disorder, unspecified: Secondary | ICD-10-CM | POA: Diagnosis not present

## 2015-09-16 DIAGNOSIS — G894 Chronic pain syndrome: Secondary | ICD-10-CM | POA: Diagnosis not present

## 2015-10-15 ENCOUNTER — Other Ambulatory Visit: Payer: Self-pay | Admitting: Nurse Practitioner

## 2015-10-15 NOTE — Telephone Encounter (Signed)
Left message NTBS 

## 2015-10-15 NOTE — Telephone Encounter (Signed)
Not seen here since 2014! Except for contraceptive shot

## 2015-10-15 NOTE — Telephone Encounter (Signed)
Last refill without being seen 

## 2015-11-03 ENCOUNTER — Ambulatory Visit: Payer: BLUE CROSS/BLUE SHIELD

## 2015-11-03 ENCOUNTER — Ambulatory Visit (INDEPENDENT_AMBULATORY_CARE_PROVIDER_SITE_OTHER): Payer: BLUE CROSS/BLUE SHIELD | Admitting: *Deleted

## 2015-11-03 DIAGNOSIS — Z3049 Encounter for surveillance of other contraceptives: Secondary | ICD-10-CM

## 2015-11-03 DIAGNOSIS — Z3042 Encounter for surveillance of injectable contraceptive: Secondary | ICD-10-CM

## 2015-11-03 NOTE — Progress Notes (Signed)
Pt given Depo-provera Tolerated well Per protocol, pt informed whe will NTBS for OV before next inj Verbalizes understanding

## 2015-12-03 DIAGNOSIS — K219 Gastro-esophageal reflux disease without esophagitis: Secondary | ICD-10-CM | POA: Diagnosis not present

## 2015-12-03 DIAGNOSIS — Z6823 Body mass index (BMI) 23.0-23.9, adult: Secondary | ICD-10-CM | POA: Diagnosis not present

## 2015-12-03 DIAGNOSIS — E538 Deficiency of other specified B group vitamins: Secondary | ICD-10-CM | POA: Diagnosis not present

## 2015-12-03 DIAGNOSIS — G950 Syringomyelia and syringobulbia: Secondary | ICD-10-CM | POA: Diagnosis not present

## 2015-12-03 DIAGNOSIS — G894 Chronic pain syndrome: Secondary | ICD-10-CM | POA: Diagnosis not present

## 2015-12-03 DIAGNOSIS — M779 Enthesopathy, unspecified: Secondary | ICD-10-CM | POA: Diagnosis not present

## 2015-12-03 DIAGNOSIS — F419 Anxiety disorder, unspecified: Secondary | ICD-10-CM | POA: Diagnosis not present

## 2015-12-03 DIAGNOSIS — Z1389 Encounter for screening for other disorder: Secondary | ICD-10-CM | POA: Diagnosis not present

## 2016-01-20 ENCOUNTER — Other Ambulatory Visit: Payer: Self-pay | Admitting: Nurse Practitioner

## 2016-01-29 ENCOUNTER — Encounter: Payer: Self-pay | Admitting: Obstetrics & Gynecology

## 2016-01-29 ENCOUNTER — Ambulatory Visit (INDEPENDENT_AMBULATORY_CARE_PROVIDER_SITE_OTHER): Payer: BLUE CROSS/BLUE SHIELD | Admitting: Obstetrics & Gynecology

## 2016-01-29 VITALS — BP 100/70 | HR 74 | Ht 65.2 in | Wt 136.0 lb

## 2016-01-29 DIAGNOSIS — Z3202 Encounter for pregnancy test, result negative: Secondary | ICD-10-CM

## 2016-01-29 DIAGNOSIS — Z30013 Encounter for initial prescription of injectable contraceptive: Secondary | ICD-10-CM | POA: Diagnosis not present

## 2016-01-29 DIAGNOSIS — Z3009 Encounter for other general counseling and advice on contraception: Secondary | ICD-10-CM | POA: Diagnosis not present

## 2016-01-29 DIAGNOSIS — R8290 Unspecified abnormal findings in urine: Secondary | ICD-10-CM | POA: Diagnosis not present

## 2016-01-29 DIAGNOSIS — Z3042 Encounter for surveillance of injectable contraceptive: Secondary | ICD-10-CM

## 2016-01-29 LAB — POCT URINALYSIS DIPSTICK
Blood, UA: NEGATIVE
Glucose, UA: NEGATIVE
KETONES UA: NEGATIVE
Nitrite, UA: NEGATIVE
PROTEIN UA: NEGATIVE

## 2016-01-29 LAB — POCT URINE PREGNANCY: PREG TEST UR: NEGATIVE

## 2016-01-29 MED ORDER — MEDROXYPROGESTERONE ACETATE 150 MG/ML IM SUSP: 150.0000 mg | INTRAMUSCULAR | 3 refills | Status: DC

## 2016-01-29 NOTE — Progress Notes (Signed)
Chief Complaint  Patient presents with  . want to get depo shot    need prescription/ c/o urine cloudy    Blood pressure 100/70, pulse 74, height 5' 5.2" (1.656 m), weight 136 lb (61.7 kg).  30 y.o. No obstetric history on file. No LMP recorded. Patient has had an injection. The current method of family planning is Depo-Provera injections.  Outpatient Encounter Prescriptions as of 01/29/2016  Medication Sig  . ALPRAZolam (XANAX) 0.5 MG tablet Take 0.5 mg by mouth 2 (two) times daily.   . Aspirin-Acetaminophen-Caffeine (GOODY HEADACHE PO) Take 1 Package by mouth daily as needed (headache).  . cyanocobalamin (,VITAMIN B-12,) 1000 MCG/ML injection Inject 1,000 mcg into the muscle once a week. On Friday.  Marland Kitchen dexlansoprazole (DEXILANT) 60 MG capsule Take 1 capsule (60 mg total) by mouth daily.  Marland Kitchen HYDROcodone-acetaminophen (NORCO) 10-325 MG per tablet Take 1 tablet by mouth 2 (two) times daily as needed for moderate pain.  . Linaclotide (LINZESS) 290 MCG CAPS capsule Take 1 capsule (290 mcg total) by mouth daily. 30 minutes before breakfast  . medroxyPROGESTERone (DEPO-PROVERA) 150 MG/ML injection Inject 1 mL (150 mg total) into the muscle every 3 (three) months.  . [DISCONTINUED] buPROPion (WELLBUTRIN XL) 150 MG 24 hr tablet TAKE 1 TABLET (150 MG TOTAL) BY MOUTH DAILY.   Facility-Administered Encounter Medications as of 01/29/2016  Medication  . medroxyPROGESTERone (DEPO-PROVERA) injection 150 mg    Subjective Pt is here just to renew script for the depo provera 150 mg, she can give it to herself Urine dips ok, she drinks mostly sweet tea, recommended water Yearly last year was normal  Objective   Pertinent ROS No burning with urination, frequency or urgency No nausea, vomiting or diarrhea Nor fever chills or other constitutional symptoms   Labs or studies UPT neg U/A negative    Impression Diagnoses this Encounter::   ICD-9-CM ICD-10-CM   1. On Depo-Provera for  contraception V25.49 Z30.40   2. Pregnancy test negative V72.41 Z32.02 POCT urine pregnancy  3. Cloudy urine 791.9 R82.90 POCT urinalysis dipstick    Established relevant diagnosis(es):   Plan/Recommendations: No orders of the defined types were placed in this encounter.   Labs or Scans Ordered: Orders Placed This Encounter  Procedures  . POCT urine pregnancy  . POCT urinalysis dipstick    Management::   Follow up No Follow-up on file.        Face to face time:  10 minutes  Greater than 50% of the visit time was spent in counseling and coordination of care with the patient.  The summary and outline of the counseling and care coordination is summarized in the note above.   All questions were answered.  Past Medical History:  Diagnosis Date  . Attention deficit disorder   . GERD (gastroesophageal reflux disease)   . Hernia, hiatal   . Irritable bowel syndrome   . Migraine   . Syringomyelia [facility])     Past Surgical History:  Procedure Laterality Date  . COLONOSCOPY  04/09/2007   [id number] rectum, colon, terminal ileum  . ESOPHAGEAL DILATION N/A 11/26/2014   Procedure: ESOPHAGEAL DILATION;  Surgeon: Daneil Dolin, MD;  [facility];  Service: Endoscopy;  Laterality: N/A;  . ESOPHAGOGASTRODUODENOSCOPY  2012   Dr. Gala Romney: normal   . ESOPHAGOGASTRODUODENOSCOPY N/A 11/26/2014   Procedure: ESOPHAGOGASTRODUODENOSCOPY (EGD);  Surgeon: Daneil Dolin, MD;  [facility];  Service: Endoscopy;  Laterality: N/A;  945 - moved  to 10/5 @ 9:15    OB History    No data available      Allergies  Allergen Reactions  . Penicillins     REACTION: unknown reaction  . Sumatriptan     REACTION: unknown reaction    Social History   Social History  . Marital status: Single    Spouse name: N/A  . Number of children: N/A  . Years of education: N/A   Occupational History  . [place] History Main Topics  . Smoking  status: Former Research scientist (life sciences)  . Smokeless tobacco: Never Used  . Alcohol use No     Comment: occ.  . Drug use: No  . Sexual activity: Yes    Birth control/ protection: Injection   Other Topics Concern  . None   Social History Narrative  . None    Family History  Problem Relation Age of Onset  . Anxiety disorder Mother   . Colon polyps Mother   . Anxiety disorder Father   . Anxiety disorder Brother   . Colon cancer Maternal Grandmother

## 2016-02-08 DIAGNOSIS — Z1389 Encounter for screening for other disorder: Secondary | ICD-10-CM | POA: Diagnosis not present

## 2016-02-08 DIAGNOSIS — Z23 Encounter for immunization: Secondary | ICD-10-CM | POA: Diagnosis not present

## 2016-02-08 DIAGNOSIS — D239 Other benign neoplasm of skin, unspecified: Secondary | ICD-10-CM | POA: Diagnosis not present

## 2016-02-08 DIAGNOSIS — G43909 Migraine, unspecified, not intractable, without status migrainosus: Secondary | ICD-10-CM | POA: Diagnosis not present

## 2016-02-08 DIAGNOSIS — F909 Attention-deficit hyperactivity disorder, unspecified type: Secondary | ICD-10-CM | POA: Diagnosis not present

## 2016-02-08 DIAGNOSIS — Z6823 Body mass index (BMI) 23.0-23.9, adult: Secondary | ICD-10-CM | POA: Diagnosis not present

## 2016-02-08 DIAGNOSIS — M5412 Radiculopathy, cervical region: Secondary | ICD-10-CM | POA: Diagnosis not present

## 2016-03-08 ENCOUNTER — Telehealth: Payer: Self-pay | Admitting: Internal Medicine

## 2016-03-08 ENCOUNTER — Other Ambulatory Visit: Payer: Self-pay

## 2016-03-08 DIAGNOSIS — R1011 Right upper quadrant pain: Secondary | ICD-10-CM

## 2016-03-08 NOTE — Telephone Encounter (Signed)
PLEASE CALL THE PATIENT ABOUT EXTREME UPPER ABD PAIN, ALMOST PASSING OUT IMMEDIATELY AFTER EATING  [phone]

## 2016-03-08 NOTE — Telephone Encounter (Signed)
Pt is aware of appt with Roseanne Kaufman, NP on 03/30/2016 at 8:30 AM.

## 2016-03-08 NOTE — Telephone Encounter (Signed)
Pt is set up for [place] on 03/11/16 @ 730. She is aware

## 2016-03-08 NOTE — Telephone Encounter (Signed)
Pt is aware. OK to schedule. Sending to the [facility] to schedule.

## 2016-03-08 NOTE — Telephone Encounter (Signed)
May order RUQ ultrasound now, and make appt to see me. Non-urgent. Gallbladder remains. May be biliary related.

## 2016-03-08 NOTE — Telephone Encounter (Signed)
Called pt and she has been having a lot of upper abdominal pain at times. It gets so bad she feels that she might pass out. It might last 20-30 min and then be over for a few hours or a few days. The pain radiates to her back some.   She said this has been going on for about 2 months, intermittently, but she has been use to pain with the IBS constipation that she has. She has had a couple of episodes at work this AM.  I told her if the pain is severe she should go to the ED. She said by the time she got there it would be over and she would be fine. But I stressed the importance of going anyway if she has SEVERE pain.   She said she is not having any problems with reflux or indigestion and takes the Dexilant daily.  Routing to Roseanne Kaufman, NP who saw pt in the office last.

## 2016-03-11 ENCOUNTER — Ambulatory Visit (HOSPITAL_COMMUNITY): Admission: RE | Admit: 2016-03-11 | Payer: BLUE CROSS/BLUE SHIELD | Source: Ambulatory Visit

## 2016-03-17 ENCOUNTER — Ambulatory Visit (HOSPITAL_COMMUNITY)
Admission: RE | Admit: 2016-03-17 | Discharge: 2016-03-17 | Disposition: A | Discharge status: Home / Self Care | Payer: BLUE CROSS/BLUE SHIELD | Source: Ambulatory Visit | Attending: Gastroenterology | Admitting: Gastroenterology

## 2016-03-17 DIAGNOSIS — R1011 Right upper quadrant pain: Secondary | ICD-10-CM

## 2016-03-17 DIAGNOSIS — K829 Disease of gallbladder, unspecified: Secondary | ICD-10-CM | POA: Insufficient documentation

## 2016-03-21 NOTE — Progress Notes (Signed)
Gallbladder sludge but no visible stones or changes of cholecystitis. Patient has upcoming appt with me.

## 2016-03-25 ENCOUNTER — Telehealth: Payer: Self-pay | Admitting: Gastroenterology

## 2016-03-25 ENCOUNTER — Ambulatory Visit: Payer: BLUE CROSS/BLUE SHIELD | Admitting: Gastroenterology

## 2016-03-25 ENCOUNTER — Encounter: Payer: Self-pay | Admitting: Gastroenterology

## 2016-03-25 NOTE — Telephone Encounter (Signed)
PT WAS A NO SHOW AND LETTER SENT  °

## 2016-03-28 DIAGNOSIS — J0100 Acute maxillary sinusitis, unspecified: Secondary | ICD-10-CM | POA: Diagnosis not present

## 2016-03-30 ENCOUNTER — Ambulatory Visit: Payer: BLUE CROSS/BLUE SHIELD | Admitting: Gastroenterology

## 2016-05-05 DIAGNOSIS — Z1389 Encounter for screening for other disorder: Secondary | ICD-10-CM | POA: Diagnosis not present

## 2016-05-05 DIAGNOSIS — F909 Attention-deficit hyperactivity disorder, unspecified type: Secondary | ICD-10-CM | POA: Diagnosis not present

## 2016-05-05 DIAGNOSIS — N760 Acute vaginitis: Secondary | ICD-10-CM | POA: Diagnosis not present

## 2016-05-05 DIAGNOSIS — Z6823 Body mass index (BMI) 23.0-23.9, adult: Secondary | ICD-10-CM | POA: Diagnosis not present

## 2016-05-05 DIAGNOSIS — G894 Chronic pain syndrome: Secondary | ICD-10-CM | POA: Diagnosis not present

## 2016-05-08 ENCOUNTER — Encounter (HOSPITAL_COMMUNITY): Payer: Self-pay | Admitting: Emergency Medicine

## 2016-05-08 ENCOUNTER — Emergency Department (HOSPITAL_COMMUNITY)
Admission: EM | Admit: 2016-05-08 | Discharge: 2016-05-08 | Disposition: A | Discharge status: Home / Self Care | Payer: BLUE CROSS/BLUE SHIELD | Attending: Emergency Medicine | Admitting: Emergency Medicine

## 2016-05-08 DIAGNOSIS — Z87891 Personal history of nicotine dependence: Secondary | ICD-10-CM | POA: Diagnosis not present

## 2016-05-08 DIAGNOSIS — R509 Fever, unspecified: Secondary | ICD-10-CM | POA: Diagnosis present

## 2016-05-08 DIAGNOSIS — B349 Viral infection, unspecified: Secondary | ICD-10-CM | POA: Insufficient documentation

## 2016-05-08 DIAGNOSIS — Z7982 Long term (current) use of aspirin: Secondary | ICD-10-CM | POA: Diagnosis not present

## 2016-05-08 LAB — CBC WITH DIFFERENTIAL/PLATELET
BASOS ABS: 0 10*3/uL (ref 0.0–0.1)
BASOS PCT: 0 %
EOS ABS: 0 10*3/uL (ref 0.0–0.7)
Eosinophils Relative: 0 %
HCT: 40.4 % (ref 36.0–46.0)
HEMOGLOBIN: 13.9 g/dL (ref 12.0–15.0)
Lymphocytes Relative: 23 %
Lymphs Abs: 1.7 10*3/uL (ref 0.7–4.0)
MCH: 31.6 pg (ref 26.0–34.0)
MCHC: 34.4 g/dL (ref 30.0–36.0)
MCV: 91.8 fL (ref 78.0–100.0)
Monocytes Absolute: 0.8 10*3/uL (ref 0.1–1.0)
Monocytes Relative: 11 %
NEUTROS PCT: 66 %
Neutro Abs: 5 10*3/uL (ref 1.7–7.7)
PLATELETS: 232 10*3/uL (ref 150–400)
RBC: 4.4 MIL/uL (ref 3.87–5.11)
RDW: 12.3 % (ref 11.5–15.5)
WBC: 7.6 10*3/uL (ref 4.0–10.5)

## 2016-05-08 LAB — RAPID STREP SCREEN (MED CTR MEBANE ONLY): Streptococcus, Group A Screen (Direct): NEGATIVE

## 2016-05-08 LAB — URINALYSIS, ROUTINE W REFLEX MICROSCOPIC
Bilirubin Urine: NEGATIVE
Glucose, UA: NEGATIVE mg/dL
HGB URINE DIPSTICK: NEGATIVE
Ketones, ur: NEGATIVE mg/dL
LEUKOCYTES UA: NEGATIVE
NITRITE: NEGATIVE
PROTEIN: NEGATIVE mg/dL
Specific Gravity, Urine: 1.013 (ref 1.005–1.030)
pH: 6 (ref 5.0–8.0)

## 2016-05-08 LAB — BASIC METABOLIC PANEL
ANION GAP: 10 (ref 5–15)
BUN: 6 mg/dL (ref 6–20)
CALCIUM: 8.8 mg/dL — AB (ref 8.9–10.3)
CHLORIDE: 104 mmol/L (ref 101–111)
CO2: 24 mmol/L (ref 22–32)
Creatinine, Ser: 0.75 mg/dL (ref 0.44–1.00)
GFR calc non Af Amer: >60 mL/min (ref >60)
GLUCOSE: 85 mg/dL (ref 65–99)
POTASSIUM: 3.1 mmol/L — AB (ref 3.5–5.1)
Sodium: 138 mmol/L (ref 135–145)

## 2016-05-08 MED ORDER — ONDANSETRON HCL 4 MG/2ML IJ SOLN
4.0000 mg | Freq: Once | INTRAMUSCULAR | Status: AC
  Administered 2016-05-08: 4 mg via INTRAVENOUS
  Filled 2016-05-08: qty 2

## 2016-05-08 MED ORDER — ONDANSETRON HCL 4 MG PO TABS: 4.0000 mg | ORAL_TABLET | Freq: Four times a day (QID) | ORAL | 0 refills | Status: DC

## 2016-05-08 MED ORDER — BENZONATATE 200 MG PO CAPS: 200.0000 mg | ORAL_CAPSULE | Freq: Three times a day (TID) | ORAL | 0 refills | Status: DC | PRN

## 2016-05-08 MED ORDER — SODIUM CHLORIDE 0.9 % IV BOLUS (SEPSIS)
1000.0000 mL | Freq: Once | INTRAVENOUS | Status: AC
  Administered 2016-05-08: 1000 mL via INTRAVENOUS

## 2016-05-08 MED ORDER — IBUPROFEN 800 MG PO TABS: 800.0000 mg | ORAL_TABLET | Freq: Three times a day (TID) | ORAL | 0 refills | Status: DC

## 2016-05-08 MED ORDER — POTASSIUM CHLORIDE CRYS ER 20 MEQ PO TBCR: 20.0000 meq | EXTENDED_RELEASE_TABLET | Freq: Two times a day (BID) | ORAL | 0 refills | Status: DC

## 2016-05-08 MED ORDER — KETOROLAC TROMETHAMINE 30 MG/ML IJ SOLN
30.0000 mg | Freq: Once | INTRAMUSCULAR | Status: AC
  Administered 2016-05-08: 30 mg via INTRAVENOUS
  Filled 2016-05-08: qty 1

## 2016-05-08 MED ORDER — POTASSIUM CHLORIDE CRYS ER 20 MEQ PO TBCR
40.0000 meq | EXTENDED_RELEASE_TABLET | Freq: Once | ORAL | Status: AC
  Administered 2016-05-08: 40 meq via ORAL
  Filled 2016-05-08: qty 2

## 2016-05-08 MED ORDER — IBUPROFEN 800 MG PO TABS
800.0000 mg | ORAL_TABLET | Freq: Once | ORAL | Status: AC
  Administered 2016-05-08: 800 mg via ORAL
  Filled 2016-05-08: qty 1

## 2016-05-08 NOTE — Discharge Instructions (Signed)
Drink plenty of fluids.  Follow-up with your doctor for recheck

## 2016-05-08 NOTE — ED Provider Notes (Signed)
[place] DEPT Provider Note   CSN: [id number] Arrival date & time: 05/08/16  1660     History   Chief Complaint Chief Complaint  Patient presents with  . Fever    HPI Karen Cantu is a 31 y.o. female.  HPI  Karen Cantu is a 31 y.o. female who presents to the Emergency Department complaining of generalized body aches, cough and chest congestion.  symptoms present for 2 days.  She describes a non-productive cough associated with nausea and several episodes of loose stools.  She reports recent sick contacts.  Subjective fever at home and taking tylenol with minimal relief.  She denies vomiting, dysuria, headaches.   Past Medical History:  Diagnosis Date  . Attention deficit disorder   . GERD (gastroesophageal reflux disease)   . Hernia, hiatal   . Irritable bowel syndrome   . Migraine   . Syringomyelia [facility])     Patient Active Problem List   Diagnosis Date Noted  . Encounter for annual physical exam 02/03/2015  . Hiatal hernia   . Gastroesophageal reflux disease without esophagitis   . GERD (gastroesophageal reflux disease) 11/07/2014  . Dysphagia, pharyngoesophageal phase 11/07/2014  . Contraception 04/08/2013  . NAUSEA 03/29/2010  . ABDOMINAL PAIN, UNSPECIFIED SITE 03/29/2010  . Constipation 05/17/2009  . HERPES ZOSTER 03/31/2009  . MIGRAINE HEADACHE 03/31/2009  . IRRITABLE BOWEL SYNDROME 03/31/2009    Past Surgical History:  Procedure Laterality Date  . COLONOSCOPY  04/09/2007   [id number] rectum, colon, terminal ileum  . ESOPHAGEAL DILATION N/A 11/26/2014   Procedure: ESOPHAGEAL DILATION;  Surgeon: Daneil Dolin, MD;  [facility];  Service: Endoscopy;  Laterality: N/A;  . ESOPHAGOGASTRODUODENOSCOPY  2012   Dr. Gala Romney: normal   . ESOPHAGOGASTRODUODENOSCOPY N/A 11/26/2014   Procedure: ESOPHAGOGASTRODUODENOSCOPY (EGD);  Surgeon: Daneil Dolin, MD;  [facility];  Service: Endoscopy;  Laterality: N/A;  945 - moved to 10/5  @ 9:15  . TOOTH EXTRACTION      OB History    Gravida Para Term Preterm AB Living   0 0 0 0 0 0   SAB TAB Ectopic Multiple Live Births   0 0 0 0 0       Home Medications    Prior to Admission medications   Medication Sig Start Date End Date Taking? Authorizing Provider  ALPRAZolam [name]) 0.5 MG tablet Take 0.5 mg by mouth 2 (two) times daily.  10/09/12  Yes Historical Provider, MD  Aspirin-Acetaminophen-Caffeine (GOODY HEADACHE PO) Take 1 Package by mouth daily as needed (headache).   Yes Historical Provider, MD  cyanocobalamin (,VITAMIN B-12,) 1000 MCG/ML injection Inject 1,000 mcg into the muscle once a week. On Friday.   Yes Historical Provider, MD  cyclobenzaprine (FLEXERIL) 10 MG tablet Take 10 mg by mouth 3 (three) times daily as needed for muscle spasms.   Yes Historical Provider, MD  HYDROcodone-acetaminophen (NORCO) 10-325 MG per tablet Take 1 tablet by mouth 2 (two) times daily as needed for moderate pain.   Yes Historical Provider, MD  medroxyPROGESTERone (DEPO-PROVERA) 150 MG/ML injection Inject 1 mL (150 mg total) into the muscle every 3 (three) months. 01/29/16  Yes Florian Buff, MD  Multiple Vitamin (MULTIVITAMIN WITH MINERALS) TABS tablet Take 1 tablet by mouth daily.   Yes Historical Provider, MD    Family History Family History  Problem Relation Age of Onset  . Anxiety disorder Mother   . Colon polyps Mother   . Anxiety disorder Father   .  Anxiety disorder Brother   . Colon cancer Maternal Grandmother     Social History Social History  Substance Use Topics  . Smoking status: Former Smoker    Types: Cigarettes  . Smokeless tobacco: Never Used  . Alcohol use No     Allergies   Penicillins and Sumatriptan   Review of Systems Review of Systems  Constitutional: Positive for chills and fever. Negative for activity change and appetite change.  HENT: Positive for congestion, rhinorrhea and sore throat. Negative for facial swelling and trouble swallowing.    Eyes: Negative for visual disturbance.  Respiratory: Positive for cough. Negative for chest tightness, shortness of breath, wheezing and stridor.   Cardiovascular: Negative for chest pain.  Gastrointestinal: Positive for diarrhea and nausea. Negative for abdominal pain and vomiting.  Genitourinary: Negative for dysuria and frequency.  Musculoskeletal: Negative for neck pain and neck stiffness.  Skin: Negative.  Negative for rash.  Neurological: Negative for dizziness, weakness, numbness and headaches.  Hematological: Negative for adenopathy.  Psychiatric/Behavioral: Negative for confusion.  All other systems reviewed and are negative.    Physical Exam Updated Vital Signs BP 118/76 (BP Location: Left Arm)   Pulse (!) 121   Temp 100.1 F (37.8 C) (Oral)   Resp 18   Ht 5\' 5"  (1.651 m)   Wt 61.2 kg   SpO2 100%   BMI 22.47 kg/m   Physical Exam  Constitutional: She is oriented to person, place, and time. She appears well-developed and well-nourished. No distress.  HENT:  Head: Normocephalic and atraumatic.  Right Ear: Tympanic membrane and ear canal normal.  Left Ear: Tympanic membrane and ear canal normal.  Nose: Mucosal edema and rhinorrhea present.  Mouth/Throat: Uvula is midline and mucous membranes are normal. No trismus in the jaw. No uvula swelling. Posterior oropharyngeal erythema present. No oropharyngeal exudate, posterior oropharyngeal edema or tonsillar abscesses.  Eyes: Conjunctivae are normal.  Neck: Normal range of motion and phonation normal. Neck supple. No Brudzinski's sign and no Kernig's sign noted.  Cardiovascular: Normal rate, regular rhythm, normal heart sounds and intact distal pulses.   No murmur heard. Pulmonary/Chest: Effort normal and breath sounds normal. No respiratory distress. She has no wheezes. She has no rales.  Abdominal: Soft. She exhibits no distension. There is no tenderness. There is no rebound and no guarding.  Musculoskeletal: She  exhibits no edema.  Lymphadenopathy:    She has no cervical adenopathy.  Neurological: She is alert and oriented to person, place, and time. She exhibits normal muscle tone. Coordination normal.  Skin: Skin is warm and dry.  Psychiatric: She has a normal mood and affect.  Nursing note and vitals reviewed.    ED Treatments / Results  Labs (all labs ordered are listed, but only abnormal results are displayed) Labs Reviewed  BASIC METABOLIC PANEL - Abnormal; Notable for the following:       Result Value   Potassium 3.1 (*)    Calcium 8.8 (*)    All other components within normal limits  RAPID STREP SCREEN (NOT AT [facility])  CULTURE, GROUP A STREP ([place])  CBC WITH DIFFERENTIAL/PLATELET  URINALYSIS, ROUTINE W REFLEX MICROSCOPIC    EKG  EKG Interpretation None       Radiology No results found.  Procedures Procedures (including critical care time)  Medications Ordered in ED Medications  potassium chloride SA (K-DUR,KLOR-CON) CR tablet 40 mEq (not administered)  sodium chloride 0.9 % bolus 1,000 mL (1,000 mLs Intravenous New Bag/Given 05/08/16 1126)  ketorolac (TORADOL)  30 MG/ML injection 30 mg (30 mg Intravenous Given 05/08/16 1127)  ondansetron (ZOFRAN) injection 4 mg (4 mg Intravenous Given 05/08/16 1127)     Initial Impression / Assessment and Plan / ED Course  I have reviewed the triage vital signs and the nursing notes.  Pertinent labs & imaging results that were available during my care of the patient were reviewed by me and considered in my medical decision making (see chart for details).     Pt is feeling better, vitals improved, mild hypokalemia. Abdomen is soft and NT.  likely result of diarrhea.  Sx's likely viral.  She appears stable for d/c.  Return precautions discussed.      Final Clinical Impressions(s) / ED Diagnoses   Final diagnoses:  Viral illness    New Prescriptions New Prescriptions   No medications on file     Bufford Lope 05/10/16 2058    Milton Ferguson, MD 05/11/16 1233

## 2016-05-08 NOTE — ED Triage Notes (Signed)
Patient c/ fever x2 days with body aches, dry cough, and chest congestion. Denies any nausea, vomiting, or diarrhea but reports generalized abd pain. Patient reports taking tylenol at 10 this morning for fevers.

## 2016-05-08 NOTE — ED Notes (Signed)
Pt made aware to return if symptoms worsen or if any life threatening symptoms occur.   

## 2016-05-11 LAB — CULTURE, GROUP A STREP (THRC)

## 2016-07-04 ENCOUNTER — Encounter: Payer: Self-pay | Admitting: Gastroenterology

## 2016-07-04 ENCOUNTER — Ambulatory Visit (INDEPENDENT_AMBULATORY_CARE_PROVIDER_SITE_OTHER): Payer: BLUE CROSS/BLUE SHIELD | Admitting: Gastroenterology

## 2016-07-04 ENCOUNTER — Other Ambulatory Visit: Payer: Self-pay

## 2016-07-04 VITALS — BP 126/86 | HR 131 | Temp 97.8°F | Ht 65.0 in | Wt 127.6 lb

## 2016-07-04 DIAGNOSIS — K5900 Constipation, unspecified: Secondary | ICD-10-CM

## 2016-07-04 DIAGNOSIS — R634 Abnormal weight loss: Secondary | ICD-10-CM

## 2016-07-04 DIAGNOSIS — R1013 Epigastric pain: Secondary | ICD-10-CM

## 2016-07-04 DIAGNOSIS — R1031 Right lower quadrant pain: Secondary | ICD-10-CM | POA: Diagnosis not present

## 2016-07-04 MED ORDER — PANTOPRAZOLE SODIUM 40 MG PO TBEC: 40.0000 mg | DELAYED_RELEASE_TABLET | Freq: Every day | ORAL | 5 refills | Status: DC
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
## 2016-07-04 NOTE — Patient Instructions (Addendum)
1. Please have your labs and CT done. We will contact you with results.  2. Start Linzess 66mcg once daily on empty stomach. If tolerated, call for prescription. If not effective enough, then start 161mcg daily. Samples of both provided. Let me know if he would like me to write a prescription for one of these. 3. Pantoprazole once daily before breakfast for abdominal pain.

## 2016-07-04 NOTE — Assessment & Plan Note (Signed)
31 y/o female with upper abd pain waking her up from sleep, improved with BM but persists for hours every morning. Described as severe in nature. Worse with food. Has lost 10 pounds in two months. Unable to eat. Complains of constipation. Unable to take Linzess 243mcg on regular basis due to diarrhea. On exam, also with RLQ tenderness.   She may have functional disease but we need to exclude other etiologies. Obtain labs. CT A/P with contrast. Add pantoprazole 40mg  daily. For constipation, trial of Linzess 45mcg daily,if not effective then 145mcg daily. Call for rx once determines the best dose for her.   Further recommendations to follow.

## 2016-07-05 ENCOUNTER — Telehealth: Payer: Self-pay | Admitting: Internal Medicine

## 2016-07-05 LAB — CBC WITH DIFFERENTIAL/PLATELET
Basophils Absolute: 0.1 10*3/uL (ref 0.0–0.2)
Basos: 1 %
EOS (ABSOLUTE): 0.2 10*3/uL (ref 0.0–0.4)
EOS: 2 %
HEMATOCRIT: 42.8 % (ref 34.0–46.6)
Hemoglobin: 14.5 g/dL (ref 11.1–15.9)
IMMATURE GRANULOCYTES: 0 %
Immature Grans (Abs): 0 10*3/uL (ref 0.0–0.1)
Lymphocytes Absolute: 3.7 10*3/uL — ABNORMAL HIGH (ref 0.7–3.1)
Lymphs: 40 %
MCH: 30.4 pg (ref 26.6–33.0)
MCHC: 33.9 g/dL (ref 31.5–35.7)
MCV: 90 fL (ref 79–97)
MONOCYTES: 5 %
MONOS ABS: 0.5 10*3/uL (ref 0.1–0.9)
NEUTROS PCT: 52 %
Neutrophils Absolute: 4.9 10*3/uL (ref 1.4–7.0)
Platelets: 330 10*3/uL (ref 150–379)
RBC: 4.77 x10E6/uL (ref 3.77–5.28)
RDW: 12.7 % (ref 12.3–15.4)
WBC: 9.2 10*3/uL (ref 3.4–10.8)

## 2016-07-05 LAB — COMPREHENSIVE METABOLIC PANEL
ALT: 15 IU/L (ref 0–32)
AST: 12 IU/L (ref 0–40)
Albumin/Globulin Ratio: 2.3 — ABNORMAL HIGH (ref 1.2–2.2)
Albumin: 5.2 g/dL (ref 3.5–5.5)
Alkaline Phosphatase: 59 IU/L (ref 39–117)
BUN/Creatinine Ratio: 11 (ref 9–23)
BUN: 8 mg/dL (ref 6–20)
Bilirubin Total: 0.2 mg/dL (ref 0.0–1.2)
CALCIUM: 9.7 mg/dL (ref 8.7–10.2)
CHLORIDE: 102 mmol/L (ref 96–106)
CO2: 18 mmol/L (ref 18–29)
CREATININE: 0.7 mg/dL (ref 0.57–1.00)
GFR calc Af Amer: 133 mL/min/{1.73_m2} (ref >59)
GFR, EST NON AFRICAN AMERICAN: 115 mL/min/{1.73_m2} (ref >59)
GLOBULIN, TOTAL: 2.3 g/dL (ref 1.5–4.5)
Glucose: 88 mg/dL (ref 65–99)
Potassium: 3.7 mmol/L (ref 3.5–5.2)
Sodium: 141 mmol/L (ref 134–144)
Total Protein: 7.5 g/dL (ref 6.0–8.5)

## 2016-07-05 LAB — LIPASE: LIPASE: 18 U/L (ref 14–72)

## 2016-07-05 LAB — TSH: TSH: 2.31 u[IU]/mL (ref 0.450–4.500)

## 2016-07-05 LAB — SEDIMENTATION RATE: SED RATE: 2 mm/h (ref 0–32)

## 2016-07-05 LAB — C-REACTIVE PROTEIN

## 2016-07-05 LAB — IGA: IGA/IMMUNOGLOBULIN A, SERUM: 121 mg/dL (ref 87–352)

## 2016-07-05 LAB — TISSUE TRANSGLUTAMINASE, IGA: Transglutaminase IgA: <2 U/mL (ref 0–3)

## 2016-07-05 MED ORDER — HYOSCYAMINE SULFATE 0.125 MG PO TABS: 0.1250 mg | ORAL_TABLET | ORAL | 0 refills | Status: DC | PRN

## 2016-07-05 NOTE — Addendum Note (Signed)
Addended by: Mahala Menghini on: 07/05/2016 01:19 PM   Modules accepted: Orders

## 2016-07-05 NOTE — Telephone Encounter (Signed)
Pt is at work and her number there is [phone]

## 2016-07-05 NOTE — Telephone Encounter (Signed)
Pt said she wanted to let LSL know that she wasn't any better and she was having severe abd pain. She said she has been up since 4 am and had blood work done yesterday and is scheduled for ct today. She wants to know if there is anything we can send in or she can buy otc to help with this pain to get her thru this?

## 2016-07-05 NOTE — Telephone Encounter (Signed)
Please clarify when patient CT as scheduled. It does not appear that is scheduled today.  Please let patient know that her blood work so far is unremarkable. I checked her pancreas, kidneys, liver, hemoglobin, inflammatory markers which were all normal. Her celiac marker is pending.  I started her on pantoprazole and linzess 83mcg yesterday, make sure that she has started these. The only thing I could provide her for pain would be levsin or Bentyl but she would need to use sparingly to avoid worsening constipation. She is already on Vicodin 10/325 QID and Xanax TID (per [place] Controlled Substance Reporting system).   IF PAIN IS SEVERE, SHE SHOULD GO TO ED.  I SENT IN RX FOR LEVSIN SL

## 2016-07-05 NOTE — Telephone Encounter (Signed)
I have sent the pt a message in mychart. Working on [employer] for [employer].

## 2016-07-05 NOTE — Telephone Encounter (Signed)
Pt called this morning to say that she had seen LSL yesterday and could she leave her a VM. I told her LSL doesn't have VM, but I could transfer her to the nurse. Pt agreed.

## 2016-07-05 NOTE — Progress Notes (Signed)
cc'ed to pcp °

## 2016-07-06 MED ORDER — DICYCLOMINE HCL 10 MG PO CAPS: 10.0000 mg | ORAL_CAPSULE | Freq: Three times a day (TID) | ORAL | 1 refills | Status: DC | PRN

## 2016-07-06 NOTE — Telephone Encounter (Signed)
Can send in bentyl 10mg  three times daily as needed for abdominal pain. Hold for constipation. #60, 1 refill.

## 2016-07-06 NOTE — Addendum Note (Signed)
Addended by: Claudina Lick on: 07/06/2016 11:01 AM   Modules accepted: Orders

## 2016-07-06 NOTE — Telephone Encounter (Signed)
levsin has been denied by her insurance because it " is not approved by the FDA"

## 2016-07-06 NOTE — Progress Notes (Signed)
All labs unremarkable. Await CT findings.

## 2016-07-06 NOTE — Telephone Encounter (Signed)
rx sent to the pharmacy. Sent pt a Therapist, music.

## 2016-07-07 ENCOUNTER — Telehealth: Payer: Self-pay

## 2016-07-07 NOTE — Telephone Encounter (Signed)
Called [employer] yesterday to follow-up on CT abd/pelvis with contrast case that was still pending. Nurse reviewer advised that case had been sent to doctor for review. Gave additional clinical info and asked the nurse to add RLQ pain and epigastric pain to case (weight loss was already on case). Nurse reviewer advised she would send info to doctor for review and a decision would be made later that day.  Received fax this morning from [facility]. CT Pelvis was not approved. CT Abdomen was approved. Authorization# for CT Abdomen: [id number]. Routing to LSL for advice. Pt is scheduled for CT abdomen/pelvis with contrast 07/19/16.

## 2016-07-12 NOTE — Telephone Encounter (Signed)
Called [employer] in reference to CT Pelvis with contrast that was denied last week per LSL's request. Case has been closed. New case can't be started for 60 days. Was transferred to physician courtesy review, [facility] with info and requested call back. Clinical info for case faxed to [place] (fax# [phone]).

## 2016-07-13 NOTE — Telephone Encounter (Signed)
Great.  Thanks

## 2016-07-13 NOTE — Telephone Encounter (Signed)
Received fax from [facility]. CT abd/pelvis approved. PA# [id number].

## 2016-07-19 ENCOUNTER — Ambulatory Visit (HOSPITAL_COMMUNITY)
Admission: RE | Admit: 2016-07-19 | Discharge: 2016-07-19 | Disposition: A | Discharge status: Home / Self Care | Payer: BLUE CROSS/BLUE SHIELD | Source: Ambulatory Visit | Attending: Gastroenterology | Admitting: Gastroenterology

## 2016-07-19 DIAGNOSIS — R634 Abnormal weight loss: Secondary | ICD-10-CM

## 2016-07-19 DIAGNOSIS — R109 Unspecified abdominal pain: Secondary | ICD-10-CM | POA: Diagnosis not present

## 2016-07-19 DIAGNOSIS — K5900 Constipation, unspecified: Secondary | ICD-10-CM | POA: Diagnosis not present

## 2016-07-19 DIAGNOSIS — R1013 Epigastric pain: Secondary | ICD-10-CM | POA: Diagnosis not present

## 2016-07-19 MED ORDER — IOPAMIDOL (ISOVUE-300) INJECTION 61%
100.0000 mL | Freq: Once | INTRAVENOUS | Status: AC | PRN
  Administered 2016-07-19: 100 mL via INTRAVENOUS

## 2016-07-20 DIAGNOSIS — S134XXA Sprain of ligaments of cervical spine, initial encounter: Secondary | ICD-10-CM | POA: Diagnosis not present

## 2016-07-20 DIAGNOSIS — G441 Vascular headache, not elsewhere classified: Secondary | ICD-10-CM | POA: Diagnosis not present

## 2016-07-20 DIAGNOSIS — M546 Pain in thoracic spine: Secondary | ICD-10-CM | POA: Diagnosis not present

## 2016-07-20 DIAGNOSIS — S233XXA Sprain of ligaments of thoracic spine, initial encounter: Secondary | ICD-10-CM | POA: Diagnosis not present

## 2016-07-25 DIAGNOSIS — G950 Syringomyelia and syringobulbia: Secondary | ICD-10-CM | POA: Diagnosis not present

## 2016-07-25 DIAGNOSIS — Z6821 Body mass index (BMI) 21.0-21.9, adult: Secondary | ICD-10-CM | POA: Diagnosis not present

## 2016-07-25 DIAGNOSIS — G894 Chronic pain syndrome: Secondary | ICD-10-CM | POA: Diagnosis not present

## 2016-07-25 DIAGNOSIS — F419 Anxiety disorder, unspecified: Secondary | ICD-10-CM | POA: Diagnosis not present

## 2016-07-25 DIAGNOSIS — Z1389 Encounter for screening for other disorder: Secondary | ICD-10-CM | POA: Diagnosis not present

## 2016-07-25 DIAGNOSIS — F909 Attention-deficit hyperactivity disorder, unspecified type: Secondary | ICD-10-CM | POA: Diagnosis not present

## 2016-07-25 NOTE — Progress Notes (Signed)
CT essentially unremarkable. She has small left ovarian cysts, likely not source of pain. She can follow up with gyn if needed but likely insignificant.   Is she feeling better on pantoprazole, bentyl, linzess?

## 2016-07-26 DIAGNOSIS — G441 Vascular headache, not elsewhere classified: Secondary | ICD-10-CM | POA: Diagnosis not present

## 2016-07-26 DIAGNOSIS — M546 Pain in thoracic spine: Secondary | ICD-10-CM | POA: Diagnosis not present

## 2016-07-26 DIAGNOSIS — S233XXA Sprain of ligaments of thoracic spine, initial encounter: Secondary | ICD-10-CM | POA: Diagnosis not present

## 2016-07-26 DIAGNOSIS — S134XXA Sprain of ligaments of cervical spine, initial encounter: Secondary | ICD-10-CM | POA: Diagnosis not present

## 2016-08-03 ENCOUNTER — Encounter: Payer: Self-pay | Admitting: Obstetrics and Gynecology

## 2016-08-03 ENCOUNTER — Ambulatory Visit (INDEPENDENT_AMBULATORY_CARE_PROVIDER_SITE_OTHER): Payer: BLUE CROSS/BLUE SHIELD | Admitting: Obstetrics and Gynecology

## 2016-08-03 VITALS — BP 100/60 | HR 72 | Wt 128.0 lb

## 2016-08-03 DIAGNOSIS — F520 Hypoactive sexual desire disorder: Secondary | ICD-10-CM | POA: Diagnosis not present

## 2016-08-03 DIAGNOSIS — F5231 Female orgasmic disorder: Secondary | ICD-10-CM | POA: Insufficient documentation

## 2016-08-03 DIAGNOSIS — N83209 Unspecified ovarian cyst, unspecified side: Secondary | ICD-10-CM | POA: Insufficient documentation

## 2016-08-03 MED ORDER — NORETHIN ACE-ETH ESTRAD-FE 1-20 MG-MCG(24) PO TABS: 1.0000 | ORAL_TABLET | Freq: Every day | ORAL | 11 refills | Status: DC

## 2016-08-03 NOTE — Progress Notes (Signed)
Patient ID: Karen Cantu, female   DOB: [birth year], 31 y.o.   MRN: [id number]   [place] [facility] Visit  @DATE @            Patient name: Karen Cantu MRN [id number]  Date of birth: 25-Sep-1985  CC & HPI:   Chief Complaint  Patient presents with  . Ovarian Cyst    left    Karen Cantu is a 31 y.o. female presenting today for f/u of left ovarian cyst, dx on 07/19/16. CT A/P noted ovarian cysts on the left, with the largest measuring 2.2 cm. She has no complaints regarding this cyst.   Pt has been on depo for 17 years, the week she became sexually active. She complains of lack of libido and orgasm since her teen years and wonders if her birth control method is the cause. She states she has never self stimulated. She also denies any h/o sexual trauma. Pt states this has significantly affected her relationships throughout the years and she would like to find a solution.   ROS:  ROS +lack of libido, arousal and orgasm   Pertinent History Reviewed:   Reviewed: Significant for ovarian cyst  Medical         Past Medical History:  Diagnosis Date  . Attention deficit disorder   . B12 deficiency   . GERD (gastroesophageal reflux disease)   . Hernia, hiatal   . Irritable bowel syndrome   . Migraine   . Ovarian cyst   . 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[place])                               Surgical Hx:    Past Surgical History:  Procedure Laterality Date  . COLONOSCOPY  04/09/2007   PFX:[id number] rectum, colon, terminal ileum  . ESOPHAGEAL DILATION N/A 11/26/2014   Procedure: ESOPHAGEAL DILATION;  Surgeon: Daneil Dolin, MD;  [facility];  Service: Endoscopy;  Laterality: N/A;  . ESOPHAGOGASTRODUODENOSCOPY  2012   Dr. Gala Romney: normal   . ESOPHAGOGASTRODUODENOSCOPY N/A 11/26/2014   Procedure: ESOPHAGOGASTRODUODENOSCOPY (EGD);  Surgeon: Daneil Dolin, MD;  [facility];  Service: Endoscopy;  Laterality: N/A;  945 - moved to 10/5 @ 9:15  . TOOTH EXTRACTION     Medications:  Reviewed & Updated - see associated section                       Current Outpatient Prescriptions:  .  ALPRAZolam (XANAX) 0.5 MG tablet, Take 0.5 mg by mouth 2 (two) times daily. , Disp: , Rfl:  .  amphetamine-dextroamphetamine (ADDERALL) 10 MG tablet, Take 10 mg by mouth 2 (two) times daily., Disp: , Rfl: 0 .  cyanocobalamin (,VITAMIN B-12,) 1000 MCG/ML injection, Inject 1,000 mcg into the muscle once a week. On Friday., Disp: , Rfl:  .  cyclobenzaprine (FLEXERIL) 10 MG tablet, Take 10 mg by mouth 3 (three) times daily as needed for muscle spasms., Disp: , Rfl:  .  HYDROcodone-acetaminophen (NORCO) 10-325 MG per tablet, Take 1 tablet by mouth 2 (two) times daily as needed for moderate pain., Disp: , Rfl:  .  ibuprofen (ADVIL,MOTRIN) 800 MG tablet, Take 1 tablet (800 mg total) by mouth 3 (three) times daily., Disp: 21 tablet, Rfl: 0 .  linaclotide (LINZESS) 290 MCG CAPS capsule, Take 290 mcg by mouth daily before breakfast., Disp: , Rfl:  .  medroxyPROGESTERone (DEPO-PROVERA) 150 MG/ML injection,  Inject 1 mL (150 mg total) into the muscle every 3 (three) months., Disp: 1 mL, Rfl: 3 .  Multiple Vitamin (MULTIVITAMIN WITH MINERALS) TABS tablet, Take 1 tablet by mouth daily., Disp: , Rfl:  .  dicyclomine (BENTYL) 10 MG capsule, Take 1 capsule (10 mg total) by mouth 3 (three) times daily as needed for spasms. (Patient not taking: Reported on 08/03/2016), Disp: 60 capsule, Rfl: 1 .  hyoscyamine (LEVSIN) 0.125 MG tablet, Take 1 tablet (0.125 mg total) by mouth every 4 (four) hours as needed. (Patient not taking: Reported on 08/03/2016), Disp: 90 tablet, Rfl: 0 .  ondansetron (ZOFRAN) 4 MG tablet, Take 1 tablet (4 mg total) by mouth every 6 (six) hours. As needed for nausea or vomiting (Patient not taking: Reported on 08/03/2016), Disp: 8 tablet, Rfl: 0 .  pantoprazole (PROTONIX) 40 MG tablet, Take 1 tablet (40 mg total) by mouth daily before breakfast. (Patient not taking: Reported on 08/03/2016), Disp: 30  tablet, Rfl: 5   Social History: Reviewed -  reports that she has quit smoking. Her smoking use included Cigarettes. She has never used smokeless tobacco.  Objective Findings:  Vitals: Blood pressure 100/60, pulse 72, weight 128 lb (58.1 kg).  Physical Examination: Extensive discussion of human libido, arousal and LARCs.   The provider spent over 45 minutes with the visit, including pre visit review, documentation, with >than 50% spent in counseling and coordination of care.   Assessment & Plan:   A:  1. F/u of 2.2 cm left ovarian cyst  2. Lack of libido and orgasm  3. 17 years of depo   P:  1. Consider bone density scan  2. D/c depo, start Lo Loestrin  3. Encouraged self exams/stimulation and discussion with partner   4. F/u in 3 months and provide referral to specialist if needed    By signing my name below, I, Hansel Feinstein, attest that this documentation has been prepared under the direction and in the presence of [name], MD. Electronically Signed: Hansel Feinstein, ED Scribe. 08/03/16. 4:35 PM.  I personally performed the services described in this documentation, which was SCRIBED in my presence. The recorded information has been reviewed and considered accurate. It has been edited as necessary during review. [name], MD

## 2016-09-20 ENCOUNTER — Telehealth: Payer: Self-pay

## 2016-09-20 NOTE — Telephone Encounter (Signed)
If she is able to tolerate drinking water, no vomiting, continue to monitor. Stick with clear liquids/full liquids today. Advance to soft diet tomorrow. May sure she takes pills with plenty of water, divide larger pills in half. Seek care if unable to tolerate liquids with drooling, regurgitating water/food, etc.

## 2016-09-20 NOTE — Telephone Encounter (Signed)
Pt called because last night she took some medication and she feels like the pill got stuck in her throat. She is able to drink but it is hurting when she eats. She is also stated that when she went to the bath room last week she had some rectal bleeding. She is worried about the pill being stuck in her throat. Please advise

## 2016-09-20 NOTE — Telephone Encounter (Signed)
Pt is aware of what AB said and she understood everything

## 2016-09-28 DIAGNOSIS — Z1389 Encounter for screening for other disorder: Secondary | ICD-10-CM | POA: Diagnosis not present

## 2016-09-28 DIAGNOSIS — Z6821 Body mass index (BMI) 21.0-21.9, adult: Secondary | ICD-10-CM | POA: Diagnosis not present

## 2016-09-28 DIAGNOSIS — G894 Chronic pain syndrome: Secondary | ICD-10-CM | POA: Diagnosis not present

## 2016-09-28 DIAGNOSIS — F419 Anxiety disorder, unspecified: Secondary | ICD-10-CM | POA: Diagnosis not present

## 2016-09-28 DIAGNOSIS — F909 Attention-deficit hyperactivity disorder, unspecified type: Secondary | ICD-10-CM | POA: Diagnosis not present

## 2016-09-28 DIAGNOSIS — E538 Deficiency of other specified B group vitamins: Secondary | ICD-10-CM | POA: Diagnosis not present

## 2016-11-02 ENCOUNTER — Ambulatory Visit: Payer: BLUE CROSS/BLUE SHIELD | Admitting: Obstetrics and Gynecology

## 2016-11-14 ENCOUNTER — Other Ambulatory Visit: Payer: Self-pay

## 2016-11-20 NOTE — Telephone Encounter (Signed)
Please verify patient wants Dexilant, we had her on pantoprazole last.

## 2016-11-22 DIAGNOSIS — Z6820 Body mass index (BMI) 20.0-20.9, adult: Secondary | ICD-10-CM | POA: Diagnosis not present

## 2016-11-22 DIAGNOSIS — M7581 Other shoulder lesions, right shoulder: Secondary | ICD-10-CM | POA: Diagnosis not present

## 2016-11-22 DIAGNOSIS — Z23 Encounter for immunization: Secondary | ICD-10-CM | POA: Diagnosis not present

## 2016-11-22 DIAGNOSIS — M25511 Pain in right shoulder: Secondary | ICD-10-CM | POA: Diagnosis not present

## 2016-11-22 NOTE — Telephone Encounter (Signed)
Message sent in my chart

## 2016-11-24 ENCOUNTER — Telehealth: Payer: Self-pay | Admitting: Internal Medicine

## 2016-11-24 NOTE — Telephone Encounter (Signed)
I have the same question as the previous phone note LSL raised: when we last saw her she was put on protonix.  I'm not sure where Dexilant is coming into the picture?

## 2016-11-24 NOTE — Telephone Encounter (Signed)
Routing to the refill box. 

## 2016-11-24 NOTE — Telephone Encounter (Signed)
Pt is needing her Dexilant 60mg  prescription called into [facility] in [place].

## 2016-11-24 NOTE — Telephone Encounter (Signed)
Pt was on dexilant 10/2014. She said she tried the pantoprazole and it didn't help at all. She had some dexilant left from 2016 and has been taking it and noticed a huge difference. She is requesting dexilant sent to [facility] in [place].

## 2016-11-28 MED ORDER — DEXLANSOPRAZOLE 60 MG PO CPDR: 60.0000 mg | DELAYED_RELEASE_CAPSULE | Freq: Every day | ORAL | 2 refills | Status: AC

## 2016-11-28 NOTE — Telephone Encounter (Signed)
Filled via refill box. Please notify the patient.

## 2016-12-19 DIAGNOSIS — Z6820 Body mass index (BMI) 20.0-20.9, adult: Secondary | ICD-10-CM | POA: Diagnosis not present

## 2016-12-19 DIAGNOSIS — Z1389 Encounter for screening for other disorder: Secondary | ICD-10-CM | POA: Diagnosis not present

## 2016-12-19 DIAGNOSIS — F909 Attention-deficit hyperactivity disorder, unspecified type: Secondary | ICD-10-CM | POA: Diagnosis not present

## 2016-12-19 DIAGNOSIS — G950 Syringomyelia and syringobulbia: Secondary | ICD-10-CM | POA: Diagnosis not present

## 2016-12-19 DIAGNOSIS — M62838 Other muscle spasm: Secondary | ICD-10-CM | POA: Diagnosis not present

## 2016-12-27 DIAGNOSIS — G950 Syringomyelia and syringobulbia: Secondary | ICD-10-CM | POA: Diagnosis not present

## 2016-12-28 DIAGNOSIS — Z113 Encounter for screening for infections with a predominantly sexual mode of transmission: Secondary | ICD-10-CM | POA: Diagnosis not present

## 2017-01-25 ENCOUNTER — Other Ambulatory Visit: Payer: Self-pay | Admitting: Obstetrics & Gynecology

## 2017-01-25 ENCOUNTER — Telehealth: Payer: Self-pay | Admitting: Obstetrics & Gynecology

## 2017-01-25 NOTE — Telephone Encounter (Signed)
LMOVM that prescription was sent to [facility] in [place] today.

## 2017-01-25 NOTE — Telephone Encounter (Signed)
Patient called stating that she would like fro Dr. Elonda Husky to call in a refill of her [facility], pt states that she called her pharmacy and they should be faxing a request over. Please contact pt

## 2017-03-13 DIAGNOSIS — M2550 Pain in unspecified joint: Secondary | ICD-10-CM | POA: Diagnosis not present

## 2017-03-13 DIAGNOSIS — F329 Major depressive disorder, single episode, unspecified: Secondary | ICD-10-CM | POA: Diagnosis not present

## 2017-03-13 DIAGNOSIS — Z6820 Body mass index (BMI) 20.0-20.9, adult: Secondary | ICD-10-CM | POA: Diagnosis not present

## 2017-03-13 DIAGNOSIS — F909 Attention-deficit hyperactivity disorder, unspecified type: Secondary | ICD-10-CM | POA: Diagnosis not present

## 2017-03-13 DIAGNOSIS — G894 Chronic pain syndrome: Secondary | ICD-10-CM | POA: Diagnosis not present

## 2017-03-13 DIAGNOSIS — F419 Anxiety disorder, unspecified: Secondary | ICD-10-CM | POA: Diagnosis not present

## 2017-03-13 DIAGNOSIS — M797 Fibromyalgia: Secondary | ICD-10-CM | POA: Diagnosis not present

## 2017-03-13 DIAGNOSIS — Z1389 Encounter for screening for other disorder: Secondary | ICD-10-CM | POA: Diagnosis not present

## 2017-05-16 DIAGNOSIS — Z6820 Body mass index (BMI) 20.0-20.9, adult: Secondary | ICD-10-CM | POA: Diagnosis not present

## 2017-05-16 DIAGNOSIS — Z1389 Encounter for screening for other disorder: Secondary | ICD-10-CM | POA: Diagnosis not present

## 2017-05-16 DIAGNOSIS — E538 Deficiency of other specified B group vitamins: Secondary | ICD-10-CM | POA: Diagnosis not present

## 2017-05-16 DIAGNOSIS — F329 Major depressive disorder, single episode, unspecified: Secondary | ICD-10-CM | POA: Diagnosis not present

## 2017-05-16 DIAGNOSIS — F909 Attention-deficit hyperactivity disorder, unspecified type: Secondary | ICD-10-CM | POA: Diagnosis not present

## 2017-05-16 DIAGNOSIS — F419 Anxiety disorder, unspecified: Secondary | ICD-10-CM | POA: Diagnosis not present

## 2017-06-26 DIAGNOSIS — F419 Anxiety disorder, unspecified: Secondary | ICD-10-CM | POA: Diagnosis not present

## 2017-06-26 DIAGNOSIS — Z1389 Encounter for screening for other disorder: Secondary | ICD-10-CM | POA: Diagnosis not present

## 2017-06-26 DIAGNOSIS — Z681 Body mass index (BMI) 19 or less, adult: Secondary | ICD-10-CM | POA: Diagnosis not present

## 2017-06-26 DIAGNOSIS — F329 Major depressive disorder, single episode, unspecified: Secondary | ICD-10-CM | POA: Diagnosis not present

## 2017-06-30 ENCOUNTER — Ambulatory Visit (HOSPITAL_COMMUNITY)
Admission: RE | Admit: 2017-06-30 | Discharge: 2017-06-30 | Disposition: A | Discharge status: Home / Self Care | Payer: BLUE CROSS/BLUE SHIELD | Source: Ambulatory Visit | Attending: Nurse Practitioner | Admitting: Nurse Practitioner

## 2017-06-30 ENCOUNTER — Other Ambulatory Visit: Payer: Self-pay | Admitting: *Deleted

## 2017-06-30 ENCOUNTER — Encounter: Payer: Self-pay | Admitting: *Deleted

## 2017-06-30 ENCOUNTER — Ambulatory Visit: Payer: BLUE CROSS/BLUE SHIELD | Admitting: Nurse Practitioner

## 2017-06-30 ENCOUNTER — Encounter: Payer: Self-pay | Admitting: Nurse Practitioner

## 2017-06-30 VITALS — BP 113/72 | HR 103 | Temp 97.0°F | Ht 65.0 in | Wt 110.0 lb

## 2017-06-30 DIAGNOSIS — K625 Hemorrhage of anus and rectum: Secondary | ICD-10-CM

## 2017-06-30 DIAGNOSIS — R109 Unspecified abdominal pain: Secondary | ICD-10-CM

## 2017-06-30 DIAGNOSIS — K581 Irritable bowel syndrome with constipation: Secondary | ICD-10-CM | POA: Diagnosis not present

## 2017-06-30 LAB — POCT PREGNANCY, URINE: PREG TEST UR: NEGATIVE

## 2017-06-30 MED ORDER — LINACLOTIDE 72 MCG PO CAPS: 72.0000 ug | ORAL_CAPSULE | Freq: Every day | ORAL | 2 refills | Status: DC

## 2017-06-30 MED ORDER — CLENPIQ 10-3.5-12 MG-GM -GM/160ML PO SOLN: 1.0000 | Freq: Once | ORAL | 0 refills | Status: AC

## 2017-06-30 NOTE — Assessment & Plan Note (Signed)
Historically she has had irritable bowel syndrome constipation type.  She has been on Linzess 290 mcg.  She was provided dosing changes at 72 and 145 mcg to see if this improved.  She states today that it did control her constipation and resulted in less diarrhea.  However, she admits she did not call and notify our office of the improvement.  Subsequently, she is still on Linzess 290 mcg.  She does have intermittent diarrhea.  She is not able to take the Linzess very often, specifically on weekdays, because of work and diarrhea as a result of Linzess.  At this point I will drop her dosage to 72 mcg to see if this will allow her to take more frequently.  Specifically she should take it daily or every other day.  I feel if we can get her constipation better controlled on a lower dose of Linzess she will have likely less resultant diarrhea.  I will also check a abdominal x-ray for stool burden or any other significant, acute pathology.  I feel her constipation is not very well controlled.  Return for follow-up in 2 months.
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
## 2017-06-30 NOTE — Patient Instructions (Signed)
1. Have your x-ray when you are able to.  You should be able to walk into the radiology department at any pen without an appointment. 2. I am giving you samples of Linzess 72 mcg.  I have also sent in a prescription.  You should be able to take this more regularly with hopefully less diarrhea.  This should help you control your constipation better. 3. We will schedule a colonoscopy for you. 4. Return for follow-up in 2 months. 5. Call us if you have any questions, concerns, or worsening problems.   At [facility] we value your feedback. You may receive a survey about your visit today. Please share your experience as we strive to create trusting relationships with our patients to provide genuine, compassionate, quality care.  It was good to see you today. I hope you have a great summer!!

## 2017-06-30 NOTE — Assessment & Plan Note (Signed)
Patient has noted rectal bleeding for about the past 5 weeks which started his toilet tissue hematochezia and progressive flaky stool with blood and mucus.  2 additional episodes over the last 5 weeks.  Seems bleeding is intermittent.  This is in the setting of IBS constipation history with new onset diarrhea that is likely due to overflow diarrhea.  Most likely benign anorectal source.  However, she is quite concerned.  Given rectal bleeding with no definite etiology we will plan for colonoscopy to further evaluate.  Proceed with TCS on propofol/MAC with Dr. Gala Romney in near future: the risks, benefits, and alternatives have been discussed with the patient in detail. The patient states understanding and desires to proceed.  The patient is currently on Xanax, Adderall, Flexeril, hydrocodone.  She also seems to be quite anxious.  No other anticoagulants, anxiolytics, chronic pain medications, or antidepressants.  We will plan on the procedure with propofol/MAC to promote adequate sedation.

## 2017-06-30 NOTE — Assessment & Plan Note (Signed)
The patient has intermittent abdominal pain.  This is likely due to her IBS constipation type given that she is not able to take Linzess very often because of the resulting diarrhea.  We will try to better control her constipation with a lower dose of Linzess, as per above.  I feel if her constipation is better controlled her abdominal pain will likely improve.  Recent CT imaging is reassuring.  Colonoscopy as per above.  Return for follow-up in 2 months.

## 2017-07-03 ENCOUNTER — Telehealth: Payer: Self-pay | Admitting: *Deleted

## 2017-07-03 NOTE — Progress Notes (Signed)
CC'D TO PCP °

## 2017-07-03 NOTE — Telephone Encounter (Signed)
Pre-op scheduled for 08/10/17 at 12:45pm. Letter mailed with appt information

## 2017-07-10 ENCOUNTER — Encounter: Payer: Self-pay | Admitting: Internal Medicine

## 2017-08-07 NOTE — Patient Instructions (Signed)
Karen Cantu  08/07/2017     @PREFPERIOPPHARMACY @   Your procedure is scheduled on  08/17/2017.  Report to [facility] at  1215  P.M.  Call this number if you have problems the morning of surgery:  [phone]   Remember:  Do not eat or drink after midnight.  You may drink clear liquids until (Follow the instructions given to you) .  Clear liquids allowed are:                    Water, Juice (non-citric and without pulp), Carbonated beverages, Clear Tea, Black Coffee only, Plain Jell-O only, Gatorade and Plain Popsicles only    Take these medicines the morning of surgery with A SIP OF WATER  Xanax, adderall, flexaril, dexilant, hydrocodone.    Do not wear jewelry, make-up or nail polish.  Do not wear lotions, powders, or perfumes, or deodorant.  Do not shave 48 hours prior to surgery.  Men may shave face and neck.  Do not bring valuables to the hospital.  [facility] is not responsible for any belongings or valuables.  Contacts, dentures or bridgework may not be worn into surgery.  Leave your suitcase in the car.  After surgery it may be brought to your room.  For patients admitted to the hospital, discharge time will be determined by your treatment team.  Patients discharged the day of surgery will not be allowed to drive home.   Name and phone number of your driver:   family Special instructions:  Follow the diet and prep instructions given to you by Dr Roseanne Kaufman office.  Please read over the following fact sheets that you were given. Anesthesia Post-op Instructions and Care and Recovery After Surgery       Colonoscopy, Adult A colonoscopy is an exam to look at the large intestine. It is done to check for problems, such as:  Lumps (tumors).  Growths (polyps).  Swelling (inflammation).  Bleeding.  What happens before the procedure? Eating and drinking Follow instructions from your doctor about eating and drinking. These instructions may  include:  A few days before the procedure - follow a low-fiber diet. ? Avoid nuts. ? Avoid seeds. ? Avoid dried fruit. ? Avoid raw fruits. ? Avoid vegetables.  1-3 days before the procedure - follow a clear liquid diet. Avoid liquids that have red or purple dye. Drink only clear liquids, such as: ? Clear broth or bouillon. ? Black coffee or tea. ? Clear juice. ? Clear soft drinks or sports drinks. ? Gelatin dessert. ? Popsicles.  On the day of the procedure - do not eat or drink anything during the 2 hours before the procedure.  Bowel prep If you were prescribed an oral bowel prep:  Take it as told by your doctor. Starting the day before your procedure, you will need to drink a lot of liquid. The liquid will cause you to poop (have bowel movements) until your poop is almost clear or light green.  If your skin or butt gets irritated from diarrhea, you may: ? Wipe the area with wipes that have medicine in them, such as adult wet wipes with aloe and vitamin E. ? Put something on your skin that soothes the area, such as petroleum jelly.  If you throw up (vomit) while drinking the bowel prep, take a break for up to 60 minutes. Then begin the bowel prep again. If you keep throwing up  and you cannot take the bowel prep without throwing up, call your doctor.  General instructions  Ask your doctor about changing or stopping your normal medicines. This is important if you take diabetes medicines or blood thinners.  Plan to have someone take you home from the hospital or clinic. What happens during the procedure?  An IV tube may be put into one of your veins.  You will be given medicine to help you relax (sedative).  To reduce your risk of infection: ? Your doctors will wash their hands. ? Your anal area will be washed with soap.  You will be asked to lie on your side with your knees bent.  Your doctor will get a long, thin, flexible tube ready. The tube will have a camera and a  light on the end.  The tube will be put into your anus.  The tube will be gently put into your large intestine.  Air will be delivered into your large intestine to keep it open. You may feel some pressure or cramping.  The camera will be used to take photos.  A small tissue sample may be removed from your body to be looked at under a microscope (biopsy). If any possible problems are found, the tissue will be sent to a lab for testing.  If small growths are found, your doctor may remove them and have them checked for cancer.  The tube that was put into your anus will be slowly removed. The procedure may vary among doctors and hospitals. What happens after the procedure?  Your doctor will check on you often until the medicines you were given have worn off.  Do not drive for 24 hours after the procedure.  You may have a small amount of blood in your poop.  You may pass gas.  You may have mild cramps or bloating in your belly (abdomen).  It is up to you to get the results of your procedure. Ask your doctor, or the department performing the procedure, when your results will be ready. This information is not intended to replace advice given to you by your health care provider. Make sure you discuss any questions you have with your health care provider. Document Released: 03/12/2010 Document Revised: 12/09/2015 Document Reviewed: 04/21/2015 Elsevier Interactive Patient Education  2017 [employer]  Colonoscopy, Adult, Care After This sheet gives you information about how to care for yourself after your procedure. Your health care provider may also give you more specific instructions. If you have problems or questions, contact your health care provider. What can I expect after the procedure? After the procedure, it is common to have:  A small amount of blood in your stool for 24 hours after the procedure.  Some gas.  Mild abdominal cramping or bloating.  Follow these  instructions at home: General instructions   For the first 24 hours after the procedure: ? Do not drive or use machinery. ? Do not sign important documents. ? Do not drink alcohol. ? Do your regular daily activities at a slower pace than normal. ? Eat soft, easy-to-digest foods. ? Rest often.  Take over-the-counter or prescription medicines only as told by your health care provider.  It is up to you to get the results of your procedure. Ask your health care provider, or the department performing the procedure, when your results will be ready. Relieving cramping and bloating  Try walking around when you have cramps or feel bloated.  Apply heat to your abdomen as  told by your health care provider. Use a heat source that your health care provider recommends, such as a moist heat pack or a heating pad. ? Place a towel between your skin and the heat source. ? Leave the heat on for 20-30 minutes. ? Remove the heat if your skin turns bright red. This is especially important if you are unable to feel pain, heat, or cold. You may have a greater risk of getting burned. Eating and drinking  Drink enough fluid to keep your urine clear or pale yellow.  Resume your normal diet as instructed by your health care provider. Avoid heavy or fried foods that are hard to digest.  Avoid drinking alcohol for as long as instructed by your health care provider. Contact a health care provider if:  You have blood in your stool 2-3 days after the procedure. Get help right away if:  You have more than a small spotting of blood in your stool.  You pass large blood clots in your stool.  Your abdomen is swollen.  You have nausea or vomiting.  You have a fever.  You have increasing abdominal pain that is not relieved with medicine. This information is not intended to replace advice given to you by your health care provider. Make sure you discuss any questions you have with your health care  provider. Document Released: 09/22/2003 Document Revised: 11/02/2015 Document Reviewed: 04/21/2015 Elsevier Interactive Patient Education  2018 [place] Anesthesia is a term that refers to techniques, procedures, and medicines that help a person stay safe and comfortable during a medical procedure. Monitored anesthesia care, or sedation, is one type of anesthesia. Your anesthesia specialist may recommend sedation if you will be having a procedure that does not require you to be unconscious, such as:  Cataract surgery.  A dental procedure.  A biopsy.  A colonoscopy.  During the procedure, you may receive a medicine to help you relax (sedative). There are three levels of sedation:  Mild sedation. At this level, you may feel awake and relaxed. You will be able to follow directions.  Moderate sedation. At this level, you will be sleepy. You may not remember the procedure.  Deep sedation. At this level, you will be asleep. You will not remember the procedure.  The more medicine you are given, the deeper your level of sedation will be. Depending on how you respond to the procedure, the anesthesia specialist may change your level of sedation or the type of anesthesia to fit your needs. An anesthesia specialist will monitor you closely during the procedure. Let your health care provider know about:  Any allergies you have.  All medicines you are taking, including vitamins, herbs, eye drops, creams, and over-the-counter medicines.  Any use of steroids (by mouth or as a cream).  Any problems you or family members have had with sedatives and anesthetic medicines.  Any blood disorders you have.  Any surgeries you have had.  Any medical conditions you have, such as sleep apnea.  Whether you are pregnant or may be pregnant.  Any use of cigarettes, alcohol, or street drugs. What are the risks? Generally, this is a safe procedure. However, problems may  occur, including:  Getting too much medicine (oversedation).  Nausea.  Allergic reaction to medicines.  Trouble breathing. If this happens, a breathing tube may be used to help with breathing. It will be removed when you are awake and breathing on your own.  Heart trouble.  Lung trouble.  Before the procedure Staying hydrated Follow instructions from your health care provider about hydration, which may include:  Up to 2 hours before the procedure - you may continue to drink clear liquids, such as water, clear fruit juice, black coffee, and plain tea.  Eating and drinking restrictions Follow instructions from your health care provider about eating and drinking, which may include:  8 hours before the procedure - stop eating heavy meals or foods such as meat, fried foods, or fatty foods.  6 hours before the procedure - stop eating light meals or foods, such as toast or cereal.  6 hours before the procedure - stop drinking milk or drinks that contain milk.  2 hours before the procedure - stop drinking clear liquids.  Medicines Ask your health care provider about:  Changing or stopping your regular medicines. This is especially important if you are taking diabetes medicines or blood thinners.  Taking medicines such as aspirin and ibuprofen. These medicines can thin your blood. Do not take these medicines before your procedure if your health care provider instructs you not to.  Tests and exams  You will have a physical exam.  You may have blood tests done to show: ? How well your kidneys and liver are working. ? How well your blood can clot.  General instructions  Plan to have someone take you home from the hospital or clinic.  If you will be going home right after the procedure, plan to have someone with you for 24 hours.  What happens during the procedure?  Your blood pressure, heart rate, breathing, level of pain and overall condition will be monitored.  An IV  tube will be inserted into one of your veins.  Your anesthesia specialist will give you medicines as needed to keep you comfortable during the procedure. This may mean changing the level of sedation.  The procedure will be performed. After the procedure  Your blood pressure, heart rate, breathing rate, and blood oxygen level will be monitored until the medicines you were given have worn off.  Do not drive for 24 hours if you received a sedative.  You may: ? Feel sleepy, clumsy, or nauseous. ? Feel forgetful about what happened after the procedure. ? Have a sore throat if you had a breathing tube during the procedure. ? Vomit. This information is not intended to replace advice given to you by your health care provider. Make sure you discuss any questions you have with your health care provider. Document Released: 11/03/2004 Document Revised: 07/17/2015 Document Reviewed: 05/31/2015 Elsevier Interactive Patient Education  2018 [place], Care After These instructions provide you with information about caring for yourself after your procedure. Your health care provider may also give you more specific instructions. Your treatment has been planned according to current medical practices, but problems sometimes occur. Call your health care provider if you have any problems or questions after your procedure. What can I expect after the procedure? After your procedure, it is common to:  Feel sleepy for several hours.  Feel clumsy and have poor balance for several hours.  Feel forgetful about what happened after the procedure.  Have poor judgment for several hours.  Feel nauseous or vomit.  Have a sore throat if you had a breathing tube during the procedure.  Follow these instructions at home: For at least 24 hours after the procedure:   Do not: ? Participate in activities in which you could fall or become injured. ? Drive. ?  Use heavy  machinery. ? Drink alcohol. ? Take sleeping pills or medicines that cause drowsiness. ? Make important decisions or sign legal documents. ? Take care of children on your own.  Rest. Eating and drinking  Follow the diet that is recommended by your health care provider.  If you vomit, drink water, juice, or soup when you can drink without vomiting.  Make sure you have little or no nausea before eating solid foods. General instructions  Have a responsible adult stay with you until you are awake and alert.  Take over-the-counter and prescription medicines only as told by your health care provider.  If you smoke, do not smoke without supervision.  Keep all follow-up visits as told by your health care provider. This is important. Contact a health care provider if:  You keep feeling nauseous or you keep vomiting.  You feel light-headed.  You develop a rash.  You have a fever. Get help right away if:  You have trouble breathing. This information is not intended to replace advice given to you by your health care provider. Make sure you discuss any questions you have with your health care provider. Document Released: 05/31/2015 Document Revised: 09/30/2015 Document Reviewed: 05/31/2015 Elsevier Interactive Patient Education  [employer].

## 2017-08-09 DIAGNOSIS — R630 Anorexia: Secondary | ICD-10-CM | POA: Diagnosis not present

## 2017-08-09 DIAGNOSIS — Z0001 Encounter for general adult medical examination with abnormal findings: Secondary | ICD-10-CM | POA: Diagnosis not present

## 2017-08-09 DIAGNOSIS — K219 Gastro-esophageal reflux disease without esophagitis: Secondary | ICD-10-CM | POA: Diagnosis not present

## 2017-08-09 DIAGNOSIS — F419 Anxiety disorder, unspecified: Secondary | ICD-10-CM | POA: Diagnosis not present

## 2017-08-09 DIAGNOSIS — Z1389 Encounter for screening for other disorder: Secondary | ICD-10-CM | POA: Diagnosis not present

## 2017-08-09 DIAGNOSIS — F329 Major depressive disorder, single episode, unspecified: Secondary | ICD-10-CM | POA: Diagnosis not present

## 2017-08-09 DIAGNOSIS — Z681 Body mass index (BMI) 19 or less, adult: Secondary | ICD-10-CM | POA: Diagnosis not present

## 2017-08-09 DIAGNOSIS — G8929 Other chronic pain: Secondary | ICD-10-CM | POA: Diagnosis not present

## 2017-08-10 ENCOUNTER — Encounter (HOSPITAL_COMMUNITY)
Admission: RE | Admit: 2017-08-10 | Discharge: 2017-08-10 | Disposition: A | Discharge status: Home / Self Care | Payer: BLUE CROSS/BLUE SHIELD | Source: Ambulatory Visit | Attending: Internal Medicine | Admitting: Internal Medicine

## 2017-08-10 ENCOUNTER — Other Ambulatory Visit: Payer: Self-pay

## 2017-08-10 ENCOUNTER — Encounter (HOSPITAL_COMMUNITY): Payer: Self-pay

## 2017-08-10 DIAGNOSIS — Z01818 Encounter for other preprocedural examination: Secondary | ICD-10-CM | POA: Insufficient documentation

## 2017-08-10 DIAGNOSIS — R109 Unspecified abdominal pain: Secondary | ICD-10-CM | POA: Diagnosis not present

## 2017-08-10 DIAGNOSIS — K581 Irritable bowel syndrome with constipation: Secondary | ICD-10-CM | POA: Diagnosis not present

## 2017-08-10 DIAGNOSIS — K625 Hemorrhage of anus and rectum: Secondary | ICD-10-CM | POA: Insufficient documentation

## 2017-08-10 DIAGNOSIS — R000 Tachycardia, unspecified: Secondary | ICD-10-CM | POA: Insufficient documentation

## 2017-08-10 HISTORY — DX: Cardiac arrhythmia, unspecified: I49.9

## 2017-08-10 LAB — BASIC METABOLIC PANEL
ANION GAP: 8 (ref 5–15)
BUN: 10 mg/dL (ref 6–20)
CHLORIDE: 104 mmol/L (ref 101–111)
CO2: 24 mmol/L (ref 22–32)
Calcium: 9 mg/dL (ref 8.9–10.3)
Creatinine, Ser: 0.64 mg/dL (ref 0.44–1.00)
GFR calc non Af Amer: >60 mL/min (ref >60)
Glucose, Bld: 74 mg/dL (ref 65–99)
POTASSIUM: 3 mmol/L — AB (ref 3.5–5.1)
SODIUM: 136 mmol/L (ref 135–145)

## 2017-08-10 LAB — CBC
HEMATOCRIT: 40.2 % (ref 36.0–46.0)
HEMOGLOBIN: 13.6 g/dL (ref 12.0–15.0)
MCH: 31.3 pg (ref 26.0–34.0)
MCHC: 33.8 g/dL (ref 30.0–36.0)
MCV: 92.4 fL (ref 78.0–100.0)
Platelets: 301 10*3/uL (ref 150–400)
RBC: 4.35 MIL/uL (ref 3.87–5.11)
RDW: 12.3 % (ref 11.5–15.5)
WBC: 8.4 10*3/uL (ref 4.0–10.5)

## 2017-08-10 LAB — HCG, SERUM, QUALITATIVE: Preg, Serum: NEGATIVE

## 2017-08-11 NOTE — Pre-Procedure Instructions (Signed)
Potassium of 3.0 shown to Dr Adora Fridge. Told him of her history of hypokalemia and told him I would call patient and see if she had supplements at home. I spoke with Mendel Ryder and she has 20 Meq potassium that she takes PRN. Instructed patient to take this BID with meals until her procedure and will recheck am of procedure. Dr Adora Fridge aware.

## 2017-08-14 ENCOUNTER — Telehealth: Payer: Self-pay | Admitting: Gastroenterology

## 2017-08-14 MED ORDER — POTASSIUM CHLORIDE ER 10 MEQ PO TBCR: 20.0000 meq | EXTENDED_RELEASE_TABLET | Freq: Two times a day (BID) | ORAL | 0 refills | Status: DC

## 2017-08-14 NOTE — Progress Notes (Signed)
Potassium low at 3.0. I sent in potassium 20 mEq BID for 3 days.

## 2017-08-14 NOTE — Telephone Encounter (Signed)
Patient called back and is aware.

## 2017-08-14 NOTE — Telephone Encounter (Signed)
Potassium 3.0. I have sent in potassium supplementation in preparation for upcoming colonoscopy. Please let patient know. FYI to [employer].

## 2017-08-14 NOTE — Telephone Encounter (Signed)
Lmom, pt notified of potassium supplement.

## 2017-08-15 ENCOUNTER — Telehealth: Payer: Self-pay | Admitting: Internal Medicine

## 2017-08-15 MED ORDER — CLENPIQ 10-3.5-12 MG-GM -GM/160ML PO SOLN: 1.0000 | Freq: Once | ORAL | 0 refills | Status: AC

## 2017-08-15 NOTE — Telephone Encounter (Signed)
Pt called her pharmacy and they haven't received her prep rx yet. Procedure is on Thursday with RMR. [facility] [place]

## 2017-08-15 NOTE — Telephone Encounter (Signed)
RX for clenpiq sent in. Patient aware. She will take the coupon for this to the pharmacy

## 2017-08-15 NOTE — Progress Notes (Signed)
I personally spoke with patient. She is aware.

## 2017-08-16 ENCOUNTER — Telehealth: Payer: Self-pay

## 2017-08-16 NOTE — Telephone Encounter (Signed)
Called pt, TCS for tomorrow moved up to 8:45am, arrive at 6:45am. Advised her to start drinking 2nd half of prep tomorrow at 3:45am. NPO after 5:45am. Will notify endo scheduler.

## 2017-08-17 ENCOUNTER — Other Ambulatory Visit: Payer: Self-pay

## 2017-08-17 ENCOUNTER — Encounter (HOSPITAL_COMMUNITY)
Admission: RE | Disposition: A | Discharge status: Home / Self Care | Payer: Self-pay | Source: Ambulatory Visit | Attending: Internal Medicine

## 2017-08-17 ENCOUNTER — Encounter (HOSPITAL_COMMUNITY): Payer: Self-pay | Admitting: *Deleted

## 2017-08-17 ENCOUNTER — Ambulatory Visit (HOSPITAL_COMMUNITY)
Admission: RE | Admit: 2017-08-17 | Discharge: 2017-08-17 | Disposition: A | Discharge status: Home / Self Care | Payer: BLUE CROSS/BLUE SHIELD | Source: Ambulatory Visit | Attending: Internal Medicine | Admitting: Internal Medicine

## 2017-08-17 ENCOUNTER — Ambulatory Visit (HOSPITAL_COMMUNITY): Payer: BLUE CROSS/BLUE SHIELD | Admitting: Anesthesiology

## 2017-08-17 DIAGNOSIS — K589 Irritable bowel syndrome without diarrhea: Secondary | ICD-10-CM | POA: Insufficient documentation

## 2017-08-17 DIAGNOSIS — E538 Deficiency of other specified B group vitamins: Secondary | ICD-10-CM | POA: Diagnosis not present

## 2017-08-17 DIAGNOSIS — Z800 Family history of malignant neoplasm of digestive organs: Secondary | ICD-10-CM | POA: Insufficient documentation

## 2017-08-17 DIAGNOSIS — K640 First degree hemorrhoids: Secondary | ICD-10-CM | POA: Diagnosis not present

## 2017-08-17 DIAGNOSIS — R109 Unspecified abdominal pain: Secondary | ICD-10-CM

## 2017-08-17 DIAGNOSIS — K219 Gastro-esophageal reflux disease without esophagitis: Secondary | ICD-10-CM | POA: Insufficient documentation

## 2017-08-17 DIAGNOSIS — Z880 Allergy status to penicillin: Secondary | ICD-10-CM | POA: Diagnosis not present

## 2017-08-17 DIAGNOSIS — F988 Other specified behavioral and emotional disorders with onset usually occurring in childhood and adolescence: Secondary | ICD-10-CM | POA: Insufficient documentation

## 2017-08-17 DIAGNOSIS — Z79899 Other long term (current) drug therapy: Secondary | ICD-10-CM | POA: Diagnosis not present

## 2017-08-17 DIAGNOSIS — K581 Irritable bowel syndrome with constipation: Secondary | ICD-10-CM

## 2017-08-17 DIAGNOSIS — Z8371 Family history of colonic polyps: Secondary | ICD-10-CM | POA: Diagnosis not present

## 2017-08-17 DIAGNOSIS — Z888 Allergy status to other drugs, medicaments and biological substances status: Secondary | ICD-10-CM | POA: Insufficient documentation

## 2017-08-17 DIAGNOSIS — K921 Melena: Secondary | ICD-10-CM | POA: Insufficient documentation

## 2017-08-17 DIAGNOSIS — D124 Benign neoplasm of descending colon: Secondary | ICD-10-CM | POA: Insufficient documentation

## 2017-08-17 DIAGNOSIS — K625 Hemorrhage of anus and rectum: Secondary | ICD-10-CM

## 2017-08-17 HISTORY — PX: POLYPECTOMY: SHX5525

## 2017-08-17 HISTORY — PX: COLONOSCOPY WITH PROPOFOL: SHX5780

## 2017-08-17 SURGERY — COLONOSCOPY WITH PROPOFOL
Anesthesia: Monitor Anesthesia Care

## 2017-08-17 MED ORDER — PROPOFOL 10 MG/ML IV BOLUS
INTRAVENOUS | Status: DC | PRN
  Administered 2017-08-17: 20 mg via INTRAVENOUS
  Administered 2017-08-17 (×2): 50 mg via INTRAVENOUS

## 2017-08-17 MED ORDER — CHLORHEXIDINE GLUCONATE CLOTH 2 % EX PADS: 6.0000 | MEDICATED_PAD | Freq: Once | CUTANEOUS | Status: DC

## 2017-08-17 MED ORDER — LIDOCAINE HCL 1 % IJ SOLN
INTRAMUSCULAR | Status: DC | PRN
  Administered 2017-08-17: 30 mg via INTRADERMAL

## 2017-08-17 MED ORDER — LIDOCAINE HCL (PF) 1 % IJ SOLN
INTRAMUSCULAR | Status: AC
  Filled 2017-08-17: qty 10

## 2017-08-17 MED ORDER — LACTATED RINGERS IV SOLN
INTRAVENOUS | Status: DC
  Administered 2017-08-17: 09:00:00 via INTRAVENOUS

## 2017-08-17 MED ORDER — PROPOFOL 10 MG/ML IV BOLUS
INTRAVENOUS | Status: AC
  Filled 2017-08-17: qty 40

## 2017-08-17 MED ORDER — PROPOFOL 500 MG/50ML IV EMUL
INTRAVENOUS | Status: DC | PRN
  Administered 2017-08-17: 100 ug/kg/min via INTRAVENOUS

## 2017-08-17 MED ORDER — MIDAZOLAM HCL 2 MG/2ML IJ SOLN
2.0000 mg | INTRAMUSCULAR | Status: DC | PRN
  Administered 2017-08-17: 2 mg via INTRAVENOUS

## 2017-08-17 MED ORDER — MIDAZOLAM HCL 2 MG/2ML IJ SOLN
INTRAMUSCULAR | Status: AC
  Filled 2017-08-17: qty 2

## 2017-08-17 NOTE — Anesthesia Preprocedure Evaluation (Signed)
Anesthesia Evaluation  Patient identified by MRN, date of birth, ID band Patient awake    Reviewed: Allergy & Precautions, H&P , NPO status , Patient's Chart, lab work & pertinent test results  Airway Mallampati: I  TM Distance: >3 FB Neck ROM: full    Dental no notable dental hx.    Pulmonary neg pulmonary ROS, Current Smoker,    Pulmonary exam normal breath sounds clear to auscultation       Cardiovascular Exercise Tolerance: Good negative cardio ROS   Rhythm:regular Rate:Normal     Neuro/Psych  Headaches, PSYCHIATRIC DISORDERS Attention deficit disorder Neuromuscular disease negative neurological ROS  negative psych ROS   GI/Hepatic negative GI ROS, Neg liver ROS, hiatal hernia, GERD  ,  Endo/Other  negative endocrine ROS  Renal/GU negative Renal ROS  negative genitourinary   Musculoskeletal   Abdominal   Peds  Hematology negative hematology ROS (+)   Anesthesia Other Findings   Reproductive/Obstetrics negative OB ROS                             Anesthesia Physical Anesthesia Plan  ASA: II  Anesthesia Plan: MAC   Post-op Pain Management:    Induction:   PONV Risk Score and Plan:   Airway Management Planned:   Additional Equipment:   Intra-op Plan:   Post-operative Plan:   Informed Consent: I have reviewed the patients History and Physical, chart, labs and discussed the procedure including the risks, benefits and alternatives for the proposed anesthesia with the patient or authorized representative who has indicated his/her understanding and acceptance.   Dental Advisory Given  Plan Discussed with: CRNA  Anesthesia Plan Comments:         Anesthesia Quick Evaluation

## 2017-08-17 NOTE — Progress Notes (Signed)
0845 Per Dr. Rick Duff no ISTAT required for Potassium level before procedure. Patient has been taking ordered potassium.

## 2017-08-17 NOTE — H&P (Signed)
done

## 2017-08-17 NOTE — Anesthesia Postprocedure Evaluation (Signed)
Anesthesia Post Note  Patient: Karen Cantu  Procedure(s) Performed: COLONOSCOPY WITH PROPOFOL (N/A ) POLYPECTOMY  Patient location during evaluation: PACU Anesthesia Type: MAC Level of consciousness: awake and alert and oriented Pain management: pain level controlled Vital Signs Assessment: post-procedure vital signs reviewed and stable Respiratory status: spontaneous breathing Cardiovascular status: blood pressure returned to baseline Postop Assessment: no apparent nausea or vomiting Anesthetic complications: no     Last Vitals:  Vitals:   08/17/17 0845 08/17/17 0950  BP: 107/76 95/74  Pulse: 89 (!) 57  Resp: 10 (!) 7  Temp: 36.8 C (P) 36.6 C  SpO2: 100% (P) 100%    Last Pain:  Vitals:   08/17/17 0845  TempSrc: Oral  PainSc:                  Tressie Stalker

## 2017-08-17 NOTE — Op Note (Signed)
[facility] Patient Name: Karen Cantu Procedure Date: 08/17/2017 8:50 AM MRN: [id number] Date of Birth: 01-15-1986 Attending MD: Norvel Richards , MD CSN: [id number] Age: 32 Admit Type: Outpatient Procedure:                Colonoscopy Indications:              Hematochezia Providers:                Norvel Richards, MD, Janeece Riggers, RN, Randa Spike, Technician Referring MD:              Medicines:                Propofol per Anesthesia Complications:            No immediate complications. Estimated Blood Loss:     Estimated blood loss was minimal. Procedure:                Pre-Anesthesia Assessment:                           - Prior to the procedure, a History and Physical                            was performed, and patient medications and                            allergies were reviewed. The patient's tolerance of                            previous anesthesia was also reviewed. The risks                            and benefits of the procedure and the sedation                            options and risks were discussed with the patient.                            All questions were answered, and informed consent                            was obtained. Prior Anticoagulants: The patient has                            taken no previous anticoagulant or antiplatelet                            agents. ASA Grade Assessment: II - A patient with                            mild systemic disease. After reviewing the risks  and benefits, the patient was deemed in                            satisfactory condition to undergo the procedure.                           After obtaining informed consent, the colonoscope                            was passed under direct vision. Throughout the                            procedure, the patient's blood pressure, pulse, and                            oxygen saturations were  monitored continuously. The                            EC-[id number] ([id number]) scope was introduced through                            the and advanced to the 5 cm into the ileum. The                            colonoscopy was performed without difficulty. The                            patient tolerated the procedure well. The quality                            of the bowel preparation was adequate. Scope In: 9:22:10 AM Scope Out: 9:42:31 AM Scope Withdrawal Time: 0 hours 14 minutes 17 seconds  Total Procedure Duration: 0 hours 20 minutes 21 seconds  Findings:      The perianal and digital rectal examinations were normal.      A 5 mm polyp was found in the descending colon. The polyp was sessile.       The polyp was removed with a cold snare. Resection and retrieval were       complete. Estimated blood loss was minimal.      Non-bleeding internal hemorrhoids were found during endoscopy. The       hemorrhoids were mild, small and Grade I (internal hemorrhoids that do       not prolapse).      The exam was otherwise without abnormality on direct and retroflexion       views. Impression:               - One 5 mm polyp in the descending colon, removed                            with a cold snare. Resected and retrieved. Normal                            terminal ileum.                           -  Non-bleeding internal hemorrhoids.                           - The examination was otherwise normal on direct                            and retroflexion views. Moderate Sedation:      Moderate (conscious) sedation was personally administered by an       anesthesia professional. The following parameters were monitored: oxygen       saturation, heart rate, blood pressure, respiratory rate, EKG, adequacy       of pulmonary ventilation, and response to care. Total physician       intraservice time was 27 minutes. Recommendation:           - Patient has a contact number available for                             emergencies. The signs and symptoms of potential                            delayed complications were discussed with the                            patient. Return to normal activities tomorrow.                            Written discharge instructions were provided to the                            patient.                           - Resume previous diet.                           - Continue present medications. Await pathology                            report.                           - Repeat colonoscopy date to be determined after                            pending pathology results are reviewed for                            surveillance.                           - Return to GI office after studies are complete.                            Begin Benefiber 1 tablespoon daily for 3 weeks?"then  increased to tablespoons twice a day thereafter. Procedure Code(s):        --- Professional ---                           [phone], Colonoscopy, flexible; with removal of                            tumor(s), polyp(s), or other lesion(s) by snare                            technique Diagnosis Code(s):        --- Professional ---                           D12.4, Benign neoplasm of descending colon                           K64.0, First degree hemorrhoids                           K92.1, Melena (includes Hematochezia) CPT copyright 2017 American Medical Association. All rights reserved. The codes documented in this report are preliminary and upon coder review may  be revised to meet current compliance requirements. Cristopher Estimable. Rourk, MD Norvel Richards, MD 08/17/2017 9:49:04 AM This report has been signed electronically. Number of Addenda: 0

## 2017-08-17 NOTE — Transfer of Care (Signed)
Immediate Anesthesia Transfer of Care Note  Patient: Karen Cantu  Procedure(s) Performed: COLONOSCOPY WITH PROPOFOL (N/A ) POLYPECTOMY  Patient Location: PACU  Anesthesia Type:MAC  Level of Consciousness: awake  Airway & Oxygen Therapy: Patient Spontanous Breathing  Post-op Assessment: Report given to RN  Post vital signs: Reviewed and stable  Last Vitals:  Vitals Value Taken Time  BP 95/74 08/17/2017  9:50 AM  Temp    Pulse 92 08/17/2017  9:52 AM  Resp 7 08/17/2017  9:51 AM  SpO2 100 % 08/17/2017  9:52 AM  Vitals shown include unvalidated device data.  Last Pain:  Vitals:   08/17/17 0845  TempSrc: Oral  PainSc:       Patients Stated Pain Goal: 8 (85/02/77 4128)  Complications: No apparent anesthesia complications

## 2017-08-17 NOTE — Discharge Instructions (Signed)
Colonoscopy Discharge Instructions  Read the instructions outlined below and refer to this sheet in the next few weeks. These discharge instructions provide you with general information on caring for yourself after you leave the hospital. Your doctor may also give you specific instructions. While your treatment has been planned according to the most current medical practices available, unavoidable complications occasionally occur. If you have any problems or questions after discharge, call Dr. Gala Romney at [phone]. ACTIVITY  You may resume your regular activity, but move at a slower pace for the next 24 hours.   Take frequent rest periods for the next 24 hours.   Walking will help get rid of the air and reduce the bloated feeling in your belly (abdomen).   No driving for 24 hours (because of the medicine (anesthesia) used during the test).    Do not sign any important legal documents or operate any machinery for 24 hours (because of the anesthesia used during the test).  NUTRITION  Drink plenty of fluids.   You may resume your normal diet as instructed by your doctor.   Begin with a light meal and progress to your normal diet. Heavy or fried foods are harder to digest and may make you feel sick to your stomach (nauseated).   Avoid alcoholic beverages for 24 hours or as instructed.  MEDICATIONS  You may resume your normal medications unless your doctor tells you otherwise.  WHAT YOU CAN EXPECT TODAY  Some feelings of bloating in the abdomen.   Passage of more gas than usual.   Spotting of blood in your stool or on the toilet paper.  IF YOU HAD POLYPS REMOVED DURING THE COLONOSCOPY:  No aspirin products for 7 days or as instructed.   No alcohol for 7 days or as instructed.   Eat a soft diet for the next 24 hours.  FINDING OUT THE RESULTS OF YOUR TEST Not all test results are available during your visit. If your test results are not back during the visit, make an appointment  with your caregiver to find out the results. Do not assume everything is normal if you have not heard from your caregiver or the medical facility. It is important for you to follow up on all of your test results.  SEEK IMMEDIATE MEDICAL ATTENTION IF:  You have more than a spotting of blood in your stool.   Your belly is swollen (abdominal distention).   You are nauseated or vomiting.   You have a temperature over 101.   You have abdominal pain or discomfort that is severe or gets worse throughout the day.    Polyp and hemorrhoid information provided  Begin Benefiber 1 tablespoon daily for 3 weeks; then increase to 1 tablespoon twice daily thereafter  Further recommendations to follow pending review of pathology report     Colon Polyps Polyps are tissue growths inside the body. Polyps can grow in many places, including the large intestine (colon). A polyp may be a round bump or a mushroom-shaped growth. You could have one polyp or several. Most colon polyps are noncancerous (benign). However, some colon polyps can become cancerous over time. What are the causes? The exact cause of colon polyps is not known. What increases the risk? This condition is more likely to develop in people who:  Have a family history of colon cancer or colon polyps.  Are older than 90 or older than 45 if they are African American.  Have inflammatory bowel disease, such as ulcerative colitis  or Crohn disease.  Are overweight.  Smoke cigarettes.  Do not get enough exercise.  Drink too much alcohol.  Eat a diet that is: ? High in fat and red meat. ? Low in fiber.  Had childhood cancer that was treated with abdominal radiation.  What are the signs or symptoms? Most polyps do not cause symptoms. If you have symptoms, they may include:  Blood coming from your rectum when having a bowel movement.  Blood in your stool.The stool may look dark red or black.  A change in bowel habits, such as  constipation or diarrhea.  How is this diagnosed? This condition is diagnosed with a colonoscopy. This is a procedure that uses a lighted, flexible scope to look at the inside of your colon. How is this treated? Treatment for this condition involves removing any polyps that are found. Those polyps will then be tested for cancer. If cancer is found, your health care provider will talk to you about options for colon cancer treatment. Follow these instructions at home: Diet  Eat plenty of fiber, such as fruits, vegetables, and whole grains.  Eat foods that are high in calcium and vitamin D, such as milk, cheese, yogurt, eggs, liver, fish, and broccoli.  Limit foods high in fat, red meats, and processed meats, such as hot dogs, sausage, bacon, and lunch meats.  Maintain a healthy weight, or lose weight if recommended by your health care provider. General instructions  Do not smoke cigarettes.  Do not drink alcohol excessively.  Keep all follow-up visits as told by your health care provider. This is important. This includes keeping regularly scheduled colonoscopies. Talk to your health care provider about when you need a colonoscopy.  Exercise every day or as told by your health care provider. Contact a health care provider if:  You have new or worsening bleeding during a bowel movement.  You have new or increased blood in your stool.  You have a change in bowel habits.  You unexpectedly lose weight. This information is not intended to replace advice given to you by your health care provider. Make sure you discuss any questions you have with your health care provider. Document Released: 11/04/2003 Document Revised: 07/16/2015 Document Reviewed: 12/29/2014 Elsevier Interactive Patient Education  2018 [employer].    Hemorrhoids Hemorrhoids are swollen veins in and around the rectum or anus. There are two types of hemorrhoids:  Internal hemorrhoids. These occur in the veins that  are just inside the rectum. They may poke through to the outside and become irritated and painful.  External hemorrhoids. These occur in the veins that are outside of the anus and can be felt as a painful swelling or hard lump near the anus.  Most hemorrhoids do not cause serious problems, and they can be managed with home treatments such as diet and lifestyle changes. If home treatments do not help your symptoms, procedures can be done to shrink or remove the hemorrhoids. What are the causes? This condition is caused by increased pressure in the anal area. This pressure may result from various things, including:  Constipation.  Straining to have a bowel movement.  Diarrhea.  Pregnancy.  Obesity.  Sitting for long periods of time.  Heavy lifting or other activity that causes you to strain.  Anal sex.  What are the signs or symptoms? Symptoms of this condition include:  Pain.  Anal itching or irritation.  Rectal bleeding.  Leakage of stool (feces).  Anal swelling.  One or more  lumps around the anus.  How is this diagnosed? This condition can often be diagnosed through a visual exam. Other exams or tests may also be done, such as:  Examination of the rectal area with a gloved hand (digital rectal exam).  Examination of the anal canal using a small tube (anoscope).  A blood test, if you have lost a significant amount of blood.  A test to look inside the colon (sigmoidoscopy or colonoscopy).  How is this treated? This condition can usually be treated at home. However, various procedures may be done if dietary changes, lifestyle changes, and other home treatments do not help your symptoms. These procedures can help make the hemorrhoids smaller or remove them completely. Some of these procedures involve surgery, and others do not. Common procedures include:  Rubber band ligation. Rubber bands are placed at the base of the hemorrhoids to cut off the blood supply to  them.  Sclerotherapy. Medicine is injected into the hemorrhoids to shrink them.  Infrared coagulation. A type of light energy is used to get rid of the hemorrhoids.  Hemorrhoidectomy surgery. The hemorrhoids are surgically removed, and the veins that supply them are tied off.  Stapled hemorrhoidopexy surgery. A circular stapling device is used to remove the hemorrhoids and use staples to cut off the blood supply to them.  Follow these instructions at home: Eating and drinking  Eat foods that have a lot of fiber in them, such as whole grains, beans, nuts, fruits, and vegetables. Ask your health care provider about taking products that have added fiber (fiber supplements).  Drink enough fluid to keep your urine clear or pale yellow. Managing pain and swelling  Take warm sitz baths for 20 minutes, 3-4 times a day to ease pain and discomfort.  If directed, apply ice to the affected area. Using ice packs between sitz baths may be helpful. ? Put ice in a plastic bag. ? Place a towel between your skin and the bag. ? Leave the ice on for 20 minutes, 2-3 times a day. General instructions  Take over-the-counter and prescription medicines only as told by your health care provider.  Use medicated creams or suppositories as told.  Exercise regularly.  Go to the bathroom when you have the urge to have a bowel movement. Do not wait.  Avoid straining to have bowel movements.  Keep the anal area dry and clean. Use wet toilet paper or moist towelettes after a bowel movement.  Do not sit on the toilet for long periods of time. This increases blood pooling and pain. Contact a health care provider if:  You have increasing pain and swelling that are not controlled by treatment or medicine.  You have uncontrolled bleeding.  You have difficulty having a bowel movement, or you are unable to have a bowel movement.  You have pain or inflammation outside the area of the hemorrhoids. This  information is not intended to replace advice given to you by your health care provider. Make sure you discuss any questions you have with your health care provider. Document Released: 02/05/2000 Document Revised: 07/08/2015 Document Reviewed: 10/22/2014 Elsevier Interactive Patient Education  2018 [employer], Care After These instructions provide you with information about caring for yourself after your procedure. Your health care provider may also give you more specific instructions. Your treatment has been planned according to current medical practices, but problems sometimes occur. Call your health care provider if you have any problems or questions after  your procedure. What can I expect after the procedure? After your procedure, it is common to:  Feel sleepy for several hours.  Feel clumsy and have poor balance for several hours.  Feel forgetful about what happened after the procedure.  Have poor judgment for several hours.  Feel nauseous or vomit.  Have a sore throat if you had a breathing tube during the procedure.  Follow these instructions at home: For at least 24 hours after the procedure:   Do not: ? Participate in activities in which you could fall or become injured. ? Drive. ? Use heavy machinery. ? Drink alcohol. ? Take sleeping pills or medicines that cause drowsiness. ? Make important decisions or sign legal documents. ? Take care of children on your own.  Rest. Eating and drinking  Follow the diet that is recommended by your health care provider.  If you vomit, drink water, juice, or soup when you can drink without vomiting.  Make sure you have little or no nausea before eating solid foods. General instructions  Have a responsible adult stay with you until you are awake and alert.  Take over-the-counter and prescription medicines only as told by your health care provider.  If you smoke, do not smoke without  supervision.  Keep all follow-up visits as told by your health care provider. This is important. Contact a health care provider if:  You keep feeling nauseous or you keep vomiting.  You feel light-headed.  You develop a rash.  You have a fever. Get help right away if:  You have trouble breathing. This information is not intended to replace advice given to you by your health care provider. Make sure you discuss any questions you have with your health care provider. Document Released: 05/31/2015 Document Revised: 09/30/2015 Document Reviewed: 05/31/2015 Elsevier Interactive Patient Education  [employer].

## 2017-08-17 NOTE — H&P (Signed)
@LOGO @   Primary Care Physician:  Redmond School, MD Primary Gastroenterologist:  Dr. Ellin Mayhew  Pre-Procedure History & Physical: HPI:  Karen Cantu is a 32 y.o. female here for colonoscopy to further evaluate rectal bleeding.  Past Medical History:  Diagnosis Date  . Attention deficit disorder   . B12 deficiency   . Dysrhythmia    increased heartrate, scheduled to see cardiology; always over 90-100.  Marland Kitchen GERD (gastroesophageal reflux disease)   . Hernia, hiatal   . Irritable bowel syndrome   . Migraine   . Ovarian cyst   . 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[place])     Past Surgical History:  Procedure Laterality Date  . COLONOSCOPY  04/09/2007   LFY:BOFBPZ rectum, colon, terminal ileum  . ESOPHAGEAL DILATION N/A 11/26/2014   Procedure: ESOPHAGEAL DILATION;  Surgeon: Daneil Dolin, MD;  [facility];  Service: Endoscopy;  Laterality: N/A;  . ESOPHAGOGASTRODUODENOSCOPY  2012   Dr. Gala Romney: normal   . ESOPHAGOGASTRODUODENOSCOPY N/A 11/26/2014   Procedure: ESOPHAGOGASTRODUODENOSCOPY (EGD);  Surgeon: Daneil Dolin, MD;  [facility];  Service: Endoscopy;  Laterality: N/A;  945 - moved to 10/5 @ 9:15  . TOOTH EXTRACTION      Prior to Admission medications   Medication Sig Start Date End Date Taking? Authorizing Provider  ALPRAZolam [name]) 1 MG tablet Take 1 mg by mouth 2 (two) times daily. 06/07/17  Yes [provider]  amphetamine-dextroamphetamine (ADDERALL) 20 MG tablet Take 20 mg by mouth See admin instructions. Take 1 capsule (20 mg) by mouth in the morning on Mondays through Fridays ONLY 06/07/17  Yes [provider]  aspirin-acetaminophen-caffeine (EXCEDRIN MIGRAINE) 250-250-65 MG tablet Take 1 tablet by mouth every 6 (six) hours as needed for headache.   Yes [provider]  cyanocobalamin (,VITAMIN B-12,) 1000 MCG/ML injection Inject 1,000 mcg into the muscle See admin instructions. INJECT 1000 MCG INTRAMUSCULARLY TWICE A MONTH   Yes [provider]  cyclobenzaprine (FLEXERIL) 10 MG tablet Take 10 mg by mouth 3 (three) times daily as needed for muscle spasms.   Yes [provider]  dexlansoprazole (DEXILANT) 60 MG capsule Take 1 capsule (60 mg total) by mouth daily. Patient taking differently: Take 60 mg by mouth every other day.  11/28/16  Yes Carlis Stable, NP  HYDROcodone-acetaminophen (NORCO) 10-325 MG per tablet Take 0.5-1 tablets by mouth 2 (two) times daily as needed (for pain.).    Yes [provider]  KAOPECTATE 262 MG/15ML suspension Take 15-30 mLs by mouth every 6 (six) hours as needed (for upset stomach). Kaopectate   Yes [provider]  linaclotide (LINZESS) 290 MCG CAPS capsule Take 290 mcg by mouth daily as needed (FOR SEVERE CONSTIPATION. (ONLY ON WEEKENDS)).    Yes [provider]  linaclotide (LINZESS) 72 MCG capsule Take 1 capsule (72 mcg total) by mouth daily before breakfast. Patient taking differently: Take 72 mcg by mouth daily as needed (for constipation. (takes during work days)).  06/30/17  Yes Carlis Stable, NP  potassium chloride (K-DUR) 10 MEQ tablet Take 2 tablets (20 mEq total) by mouth 2 (two) times daily for 3 days. 08/14/17 08/17/17 Yes Annitta Needs, NP  MedroxyPROGESTERone Acetate 150 MG/ML SUSY INJECT 1 ML (150 MG TOTAL) INTO THE MUSCLE EVERY 3 MONTHS 01/25/17   Florian Buff, MD  Multiple Vitamins-Minerals (ADULT GUMMY PO) Take 1 tablet by mouth 2 (two) times daily.    [provider]  naproxen sodium (ALEVE) 220 MG tablet  Take 660 mg by mouth 2 (two) times daily as needed (FOR INFLAMMATION.).     [provider]    Allergies as of 06/30/2017 - Review Complete 06/30/2017  Allergen Reaction Noted  . Penicillins    . Sumatriptan      Family History  Problem Relation Age of Onset  . Anxiety disorder Mother   . Colon polyps Mother   . Anxiety disorder Father   . Anxiety disorder Brother   . Colon cancer Maternal Grandmother     Social  History   Socioeconomic History  . Marital status: Single    Spouse name: Not on file  . Number of children: Not on file  . Years of education: Not on file  . Highest education level: Not on file  Occupational History  . Occupation: Estate manager/land agent    Comment: [employer]  . Financial resource strain: Not on file  . Food insecurity:    Worry: Not on file    Inability: Not on file  . Transportation needs:    Medical: Not on file    Non-medical: Not on file  Tobacco Use  . Smoking status: Current Every Day Smoker    Packs/day: 0.25    Years: 30.00    Pack years: 7.50    Types: Cigarettes  . Smokeless tobacco: Never Used  Substance and Sexual Activity  . Alcohol use: Not on file    Comment: 4th of July  . Drug use: No  . Sexual activity: Yes    Birth control/protection: Injection  Lifestyle  . Physical activity:    Days per week: Not on file    Minutes per session: Not on file  . Stress: Not on file  Relationships  . Social connections:    Talks on phone: Not on file    Gets together: Not on file    Attends religious service: Not on file    Active member of club or organization: Not on file    Attends meetings of clubs or organizations: Not on file    Relationship status: Not on file  . Intimate partner violence:    Fear of current or ex partner: Not on file    Emotionally abused: Not on file    Physically abused: Not on file    Forced sexual activity: Not on file  Other Topics Concern  . Not on file  Social History Narrative  . Not on file    Review of Systems: See HPI, otherwise negative ROS  Physical Exam: BP 107/76   Pulse 89   Temp 98.2 F (36.8 C) (Oral)   Resp 10   SpO2 100%  General:   Alert,  Well-developed, well-nourished, pleasant and cooperative in NAD Neck:  Supple; no masses or thyromegaly. No significant cervical adenopathy. Lungs:  Clear throughout to auscultation.   No wheezes, crackles, or rhonchi. No acute  distress. Heart:  Regular rate and rhythm; no murmurs, clicks, rubs,  or gallops. Abdomen: Non-distended, normal bowel sounds.  Soft and nontender without appreciable mass or hepatosplenomegaly.  Pulses:  Normal pulses noted. Extremities:  Without clubbing or edema.  Impression/Plan: 32 year old lady with intermittent rectal bleeding. Colonoscopy to further evaluate.The risks, benefits, limitations, alternatives and imponderables have been reviewed with the patient. Questions have been answered. All parties are agreeable.      Notice: This dictation was prepared with Dragon dictation along with smaller phrase technology. Any transcriptional errors that result from this process are unintentional and may  not be corrected upon review.

## 2017-08-19 ENCOUNTER — Encounter: Payer: Self-pay | Admitting: Internal Medicine

## 2017-08-21 ENCOUNTER — Encounter (HOSPITAL_COMMUNITY): Payer: Self-pay | Admitting: Internal Medicine

## 2017-10-03 ENCOUNTER — Ambulatory Visit: Payer: BLUE CROSS/BLUE SHIELD | Admitting: Internal Medicine

## 2017-10-17 ENCOUNTER — Telehealth: Payer: Self-pay | Admitting: Internal Medicine

## 2017-10-17 ENCOUNTER — Encounter: Payer: Self-pay | Admitting: Internal Medicine

## 2017-10-17 ENCOUNTER — Ambulatory Visit: Payer: BLUE CROSS/BLUE SHIELD | Admitting: Internal Medicine

## 2017-10-17 NOTE — Telephone Encounter (Signed)
PATIENT WAS A NO SHOW AND LETTER SENT  °

## 2017-10-18 DIAGNOSIS — Z681 Body mass index (BMI) 19 or less, adult: Secondary | ICD-10-CM | POA: Diagnosis not present

## 2017-10-18 DIAGNOSIS — Z0000 Encounter for general adult medical examination without abnormal findings: Secondary | ICD-10-CM | POA: Diagnosis not present

## 2017-10-18 DIAGNOSIS — Z1389 Encounter for screening for other disorder: Secondary | ICD-10-CM | POA: Diagnosis not present

## 2017-11-20 DIAGNOSIS — Z23 Encounter for immunization: Secondary | ICD-10-CM | POA: Diagnosis not present

## 2017-11-20 DIAGNOSIS — G894 Chronic pain syndrome: Secondary | ICD-10-CM | POA: Diagnosis not present

## 2017-11-20 DIAGNOSIS — Z681 Body mass index (BMI) 19 or less, adult: Secondary | ICD-10-CM | POA: Diagnosis not present

## 2017-11-20 DIAGNOSIS — E876 Hypokalemia: Secondary | ICD-10-CM | POA: Diagnosis not present

## 2017-11-20 DIAGNOSIS — Z1389 Encounter for screening for other disorder: Secondary | ICD-10-CM | POA: Diagnosis not present

## 2017-11-20 DIAGNOSIS — F419 Anxiety disorder, unspecified: Secondary | ICD-10-CM | POA: Diagnosis not present

## 2018-02-19 DIAGNOSIS — G894 Chronic pain syndrome: Secondary | ICD-10-CM | POA: Diagnosis not present

## 2018-02-19 DIAGNOSIS — Z681 Body mass index (BMI) 19 or less, adult: Secondary | ICD-10-CM | POA: Diagnosis not present

## 2018-02-19 DIAGNOSIS — Z1389 Encounter for screening for other disorder: Secondary | ICD-10-CM | POA: Diagnosis not present

## 2018-02-19 DIAGNOSIS — K219 Gastro-esophageal reflux disease without esophagitis: Secondary | ICD-10-CM | POA: Diagnosis not present

## 2018-02-19 DIAGNOSIS — F419 Anxiety disorder, unspecified: Secondary | ICD-10-CM | POA: Diagnosis not present

## 2018-03-23 DIAGNOSIS — B001 Herpesviral vesicular dermatitis: Secondary | ICD-10-CM | POA: Diagnosis not present

## 2018-03-23 DIAGNOSIS — G894 Chronic pain syndrome: Secondary | ICD-10-CM | POA: Diagnosis not present

## 2018-03-23 DIAGNOSIS — Z681 Body mass index (BMI) 19 or less, adult: Secondary | ICD-10-CM | POA: Diagnosis not present

## 2018-03-23 DIAGNOSIS — F909 Attention-deficit hyperactivity disorder, unspecified type: Secondary | ICD-10-CM | POA: Diagnosis not present

## 2018-04-06 ENCOUNTER — Other Ambulatory Visit: Payer: Self-pay | Admitting: Obstetrics & Gynecology

## 2018-04-09 ENCOUNTER — Encounter: Payer: Self-pay | Admitting: *Deleted

## 2018-04-10 ENCOUNTER — Other Ambulatory Visit: Payer: Self-pay | Admitting: *Deleted

## 2018-04-13 DIAGNOSIS — Z1389 Encounter for screening for other disorder: Secondary | ICD-10-CM | POA: Diagnosis not present

## 2018-04-13 DIAGNOSIS — Z681 Body mass index (BMI) 19 or less, adult: Secondary | ICD-10-CM | POA: Diagnosis not present

## 2018-04-13 DIAGNOSIS — G894 Chronic pain syndrome: Secondary | ICD-10-CM | POA: Diagnosis not present

## 2018-04-13 DIAGNOSIS — F909 Attention-deficit hyperactivity disorder, unspecified type: Secondary | ICD-10-CM | POA: Diagnosis not present

## 2018-04-13 DIAGNOSIS — J329 Chronic sinusitis, unspecified: Secondary | ICD-10-CM | POA: Diagnosis not present

## 2018-04-24 IMAGING — US US ABDOMEN LIMITED
1 series · 14 of 25 positions shown · non-contrast
Comparison: 03/22/2007

CLINICAL DATA: Postprandial right upper quadrant pain for 2 months

EXAM:
US ABDOMEN LIMITED - RIGHT UPPER QUADRANT

[Series 1: us abdomen limited · 0.18mm/px · 14 of 63 slices shown]
[im 1/63]
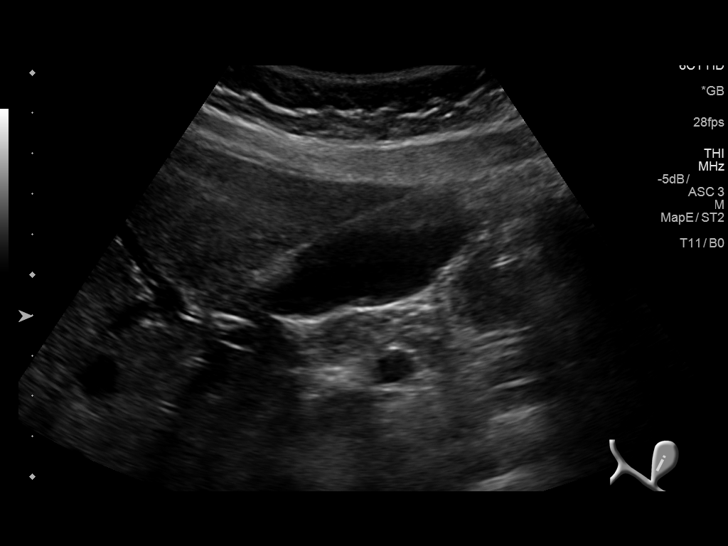
[im 6/63]
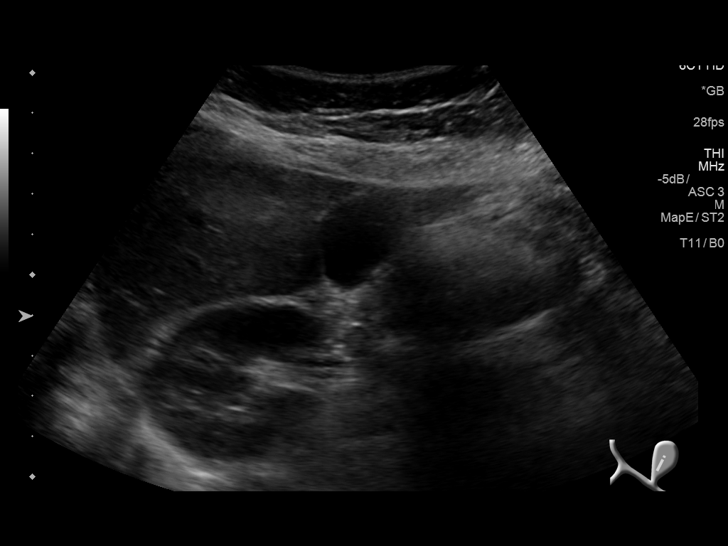
[im 11/63]
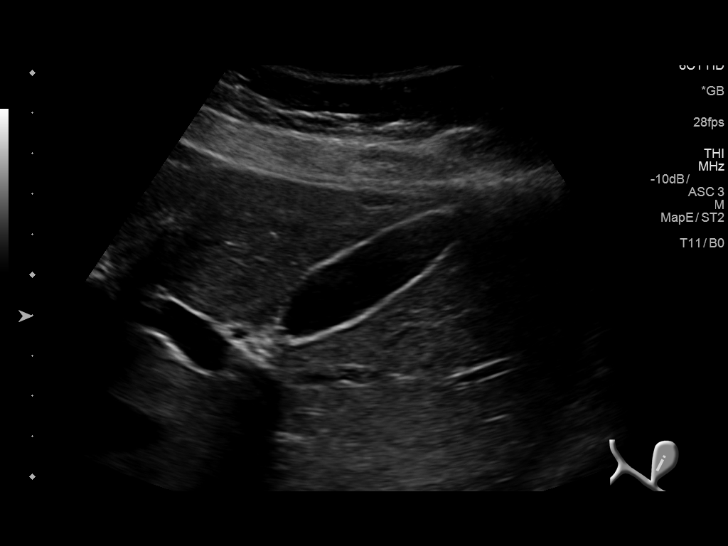
[im 16/63]
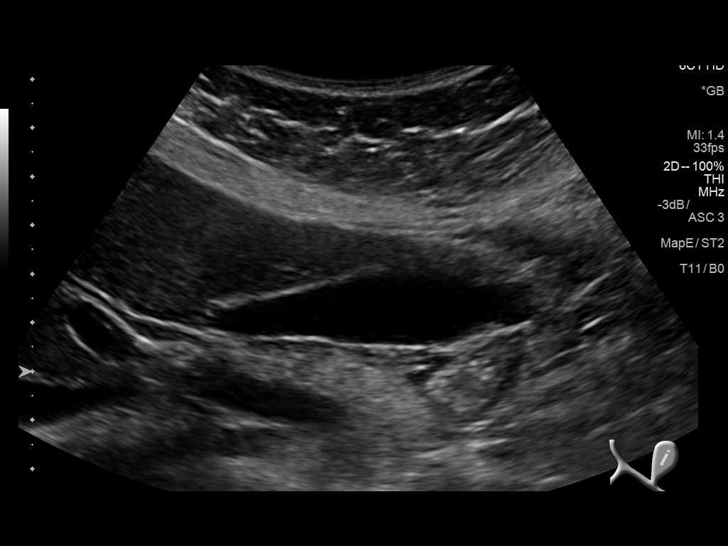
[im 21/63]
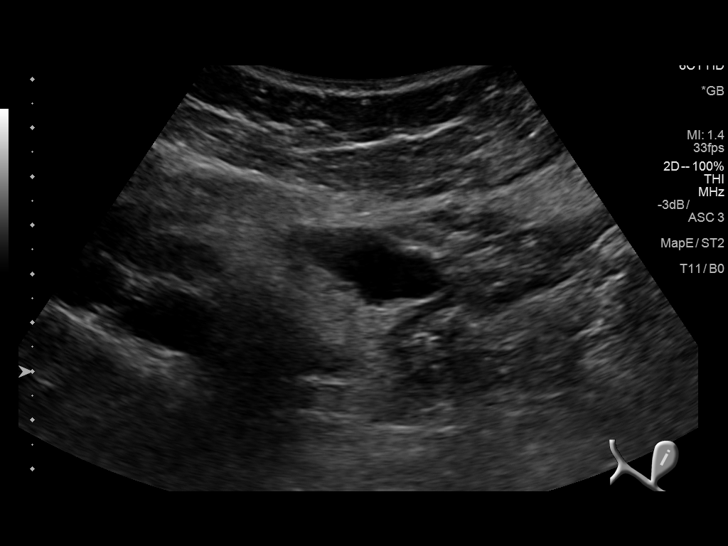
[im 24/63]
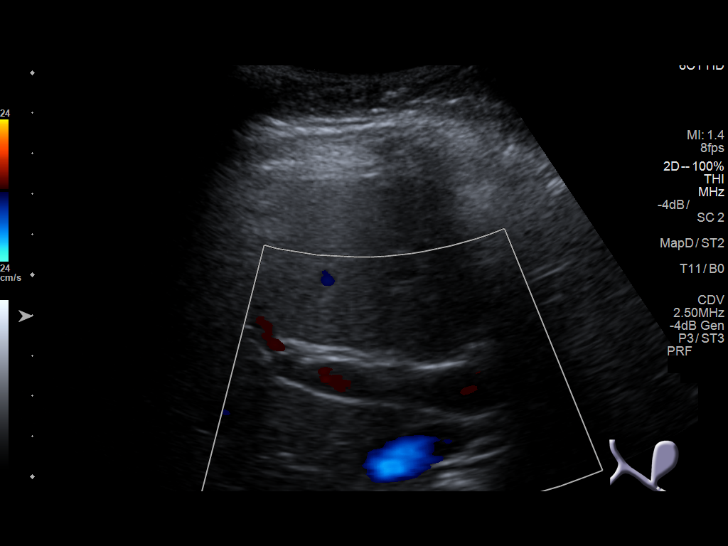
[im 29/63]
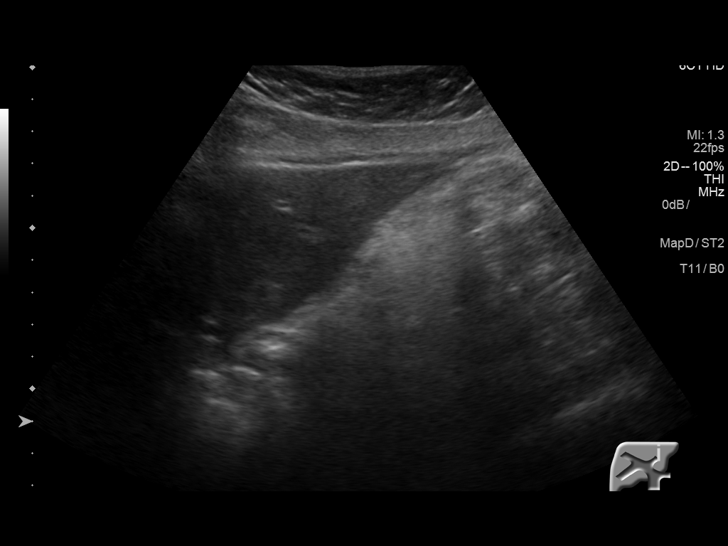
[im 34/63]
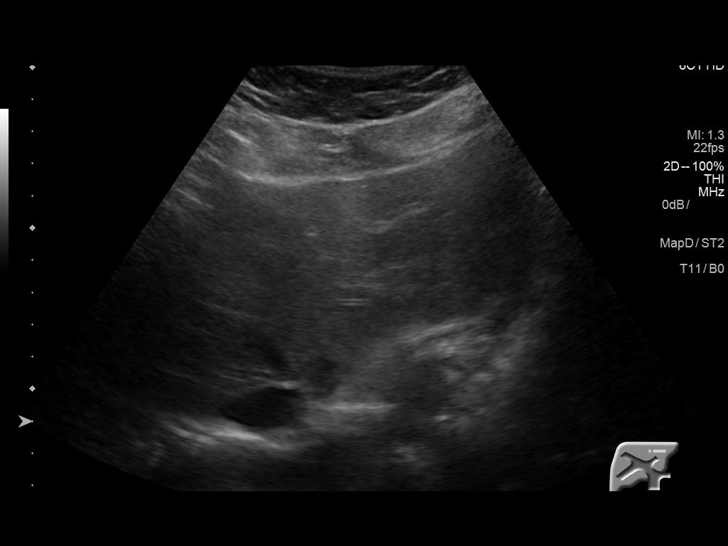
[im 39/63]
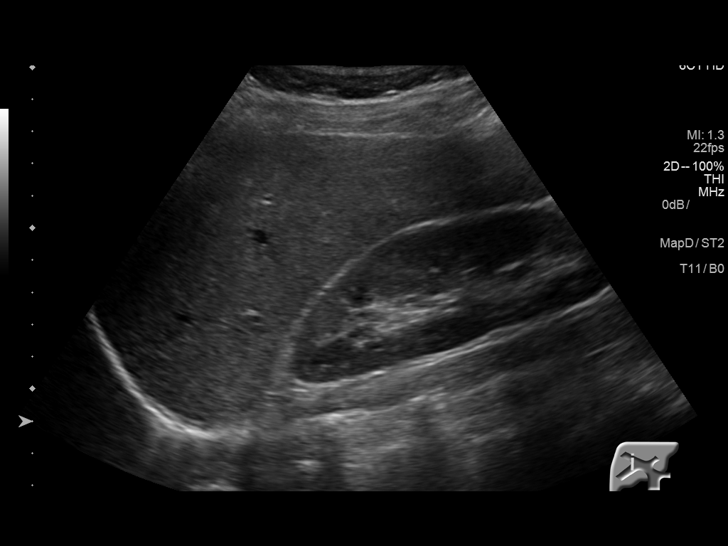
[im 42/63]
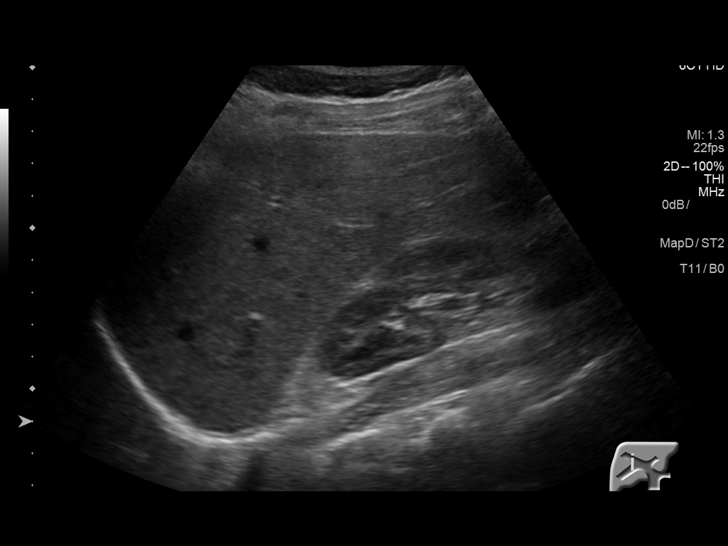
[im 47/63]
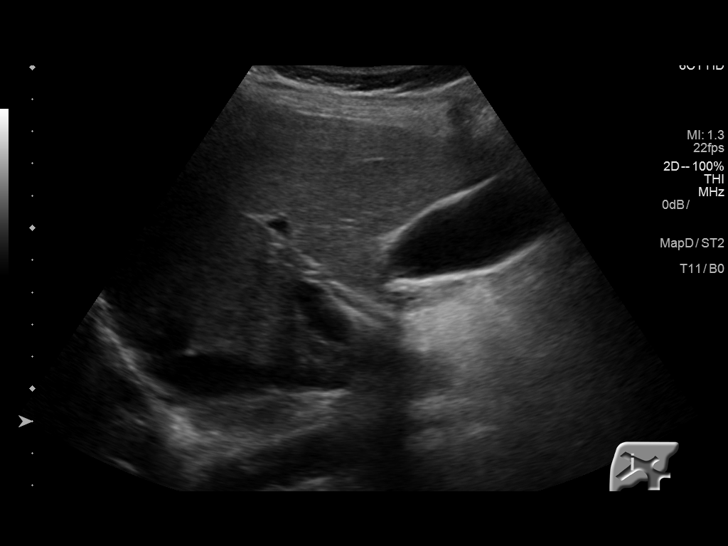
[im 52/63]
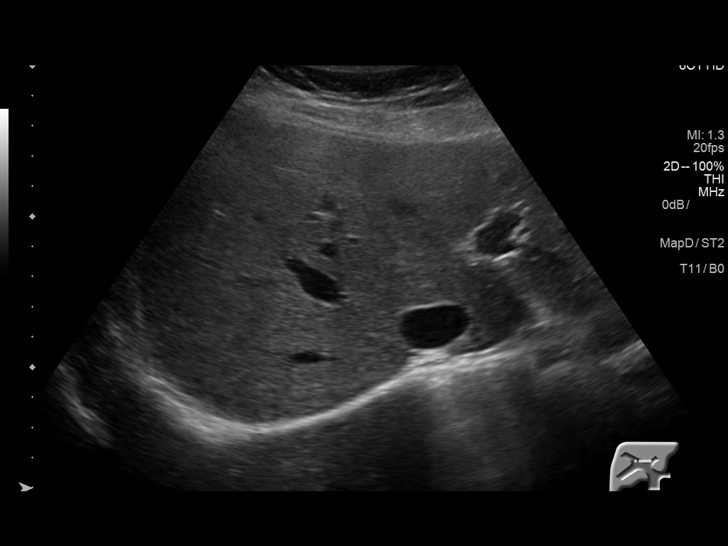
[im 57/63]
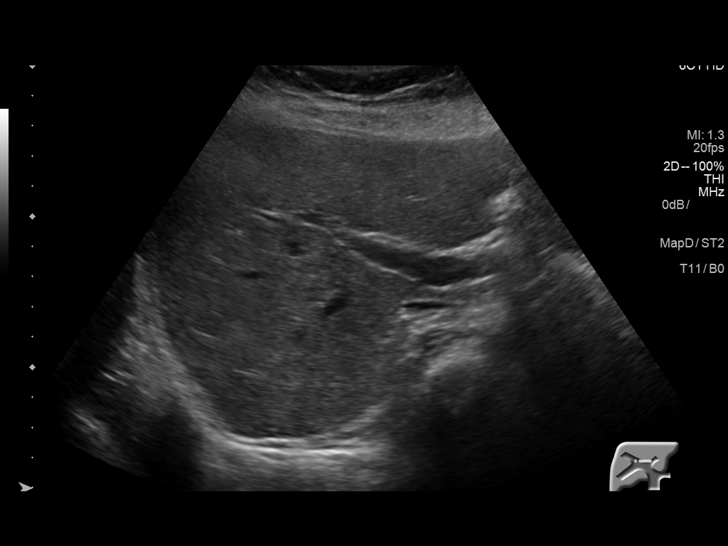
[im 63/63]
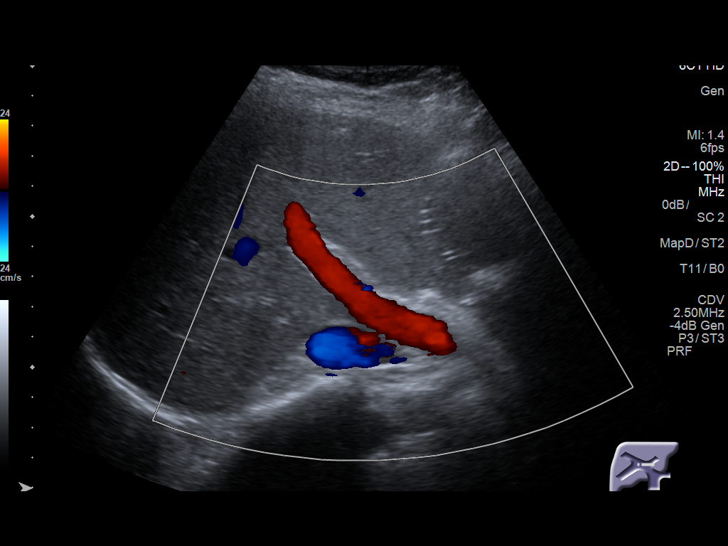

[14 of 25 positions shown; findings below may reference images not displayed]

FINDINGS: Gallbladder:

Small amount of sludge within the gallbladder. No wall thickening or
stones. Negative sonographic [place].

Common bile duct:

Diameter: Normal caliber, 2 mm

Liver:

No focal lesion identified. Within normal limits in parenchymal
echogenicity.
IMPRESSION: Small amount of sludge within the gallbladder. No visible stones or
changes of cholecystitis.

## 2018-05-01 ENCOUNTER — Other Ambulatory Visit: Payer: Self-pay | Admitting: Obstetrics and Gynecology

## 2018-05-08 ENCOUNTER — Other Ambulatory Visit: Payer: Self-pay | Admitting: Obstetrics & Gynecology

## 2018-05-09 DIAGNOSIS — G950 Syringomyelia and syringobulbia: Secondary | ICD-10-CM | POA: Diagnosis not present

## 2018-05-09 DIAGNOSIS — F909 Attention-deficit hyperactivity disorder, unspecified type: Secondary | ICD-10-CM | POA: Diagnosis not present

## 2018-05-09 DIAGNOSIS — Z1389 Encounter for screening for other disorder: Secondary | ICD-10-CM | POA: Diagnosis not present

## 2018-05-09 DIAGNOSIS — Z681 Body mass index (BMI) 19 or less, adult: Secondary | ICD-10-CM | POA: Diagnosis not present

## 2018-05-09 DIAGNOSIS — G894 Chronic pain syndrome: Secondary | ICD-10-CM | POA: Diagnosis not present

## 2018-07-05 DIAGNOSIS — F909 Attention-deficit hyperactivity disorder, unspecified type: Secondary | ICD-10-CM | POA: Diagnosis not present

## 2018-07-05 DIAGNOSIS — G894 Chronic pain syndrome: Secondary | ICD-10-CM | POA: Diagnosis not present

## 2018-09-06 DIAGNOSIS — F419 Anxiety disorder, unspecified: Secondary | ICD-10-CM | POA: Diagnosis not present

## 2018-09-06 DIAGNOSIS — G894 Chronic pain syndrome: Secondary | ICD-10-CM | POA: Diagnosis not present

## 2018-09-06 DIAGNOSIS — F909 Attention-deficit hyperactivity disorder, unspecified type: Secondary | ICD-10-CM | POA: Diagnosis not present

## 2018-11-09 DIAGNOSIS — G894 Chronic pain syndrome: Secondary | ICD-10-CM | POA: Diagnosis not present

## 2018-11-09 DIAGNOSIS — F909 Attention-deficit hyperactivity disorder, unspecified type: Secondary | ICD-10-CM | POA: Diagnosis not present

## 2018-11-13 DIAGNOSIS — Z111 Encounter for screening for respiratory tuberculosis: Secondary | ICD-10-CM | POA: Diagnosis not present

## 2018-12-04 ENCOUNTER — Other Ambulatory Visit: Payer: Self-pay | Admitting: Obstetrics & Gynecology

## 2018-12-07 DIAGNOSIS — T50905A Adverse effect of unspecified drugs, medicaments and biological substances, initial encounter: Secondary | ICD-10-CM | POA: Diagnosis not present

## 2018-12-07 DIAGNOSIS — F909 Attention-deficit hyperactivity disorder, unspecified type: Secondary | ICD-10-CM | POA: Diagnosis not present

## 2018-12-07 DIAGNOSIS — G894 Chronic pain syndrome: Secondary | ICD-10-CM | POA: Diagnosis not present

## 2018-12-07 DIAGNOSIS — F419 Anxiety disorder, unspecified: Secondary | ICD-10-CM | POA: Diagnosis not present

## 2019-12-20 ENCOUNTER — Other Ambulatory Visit: Payer: Self-pay | Admitting: Obstetrics & Gynecology

## 2020-04-27 IMAGING — DX DG ABDOMEN 2V
2 series · 2 of 2 positions shown · non-contrast
Comparison: 07/19/2016 CT

CLINICAL DATA: Irritable bowel syndrome. Rectal bleeding. Abdominal
pain. Evaluate stool burden

EXAM:
ABDOMEN - 2 VIEW

[abdomen erect]
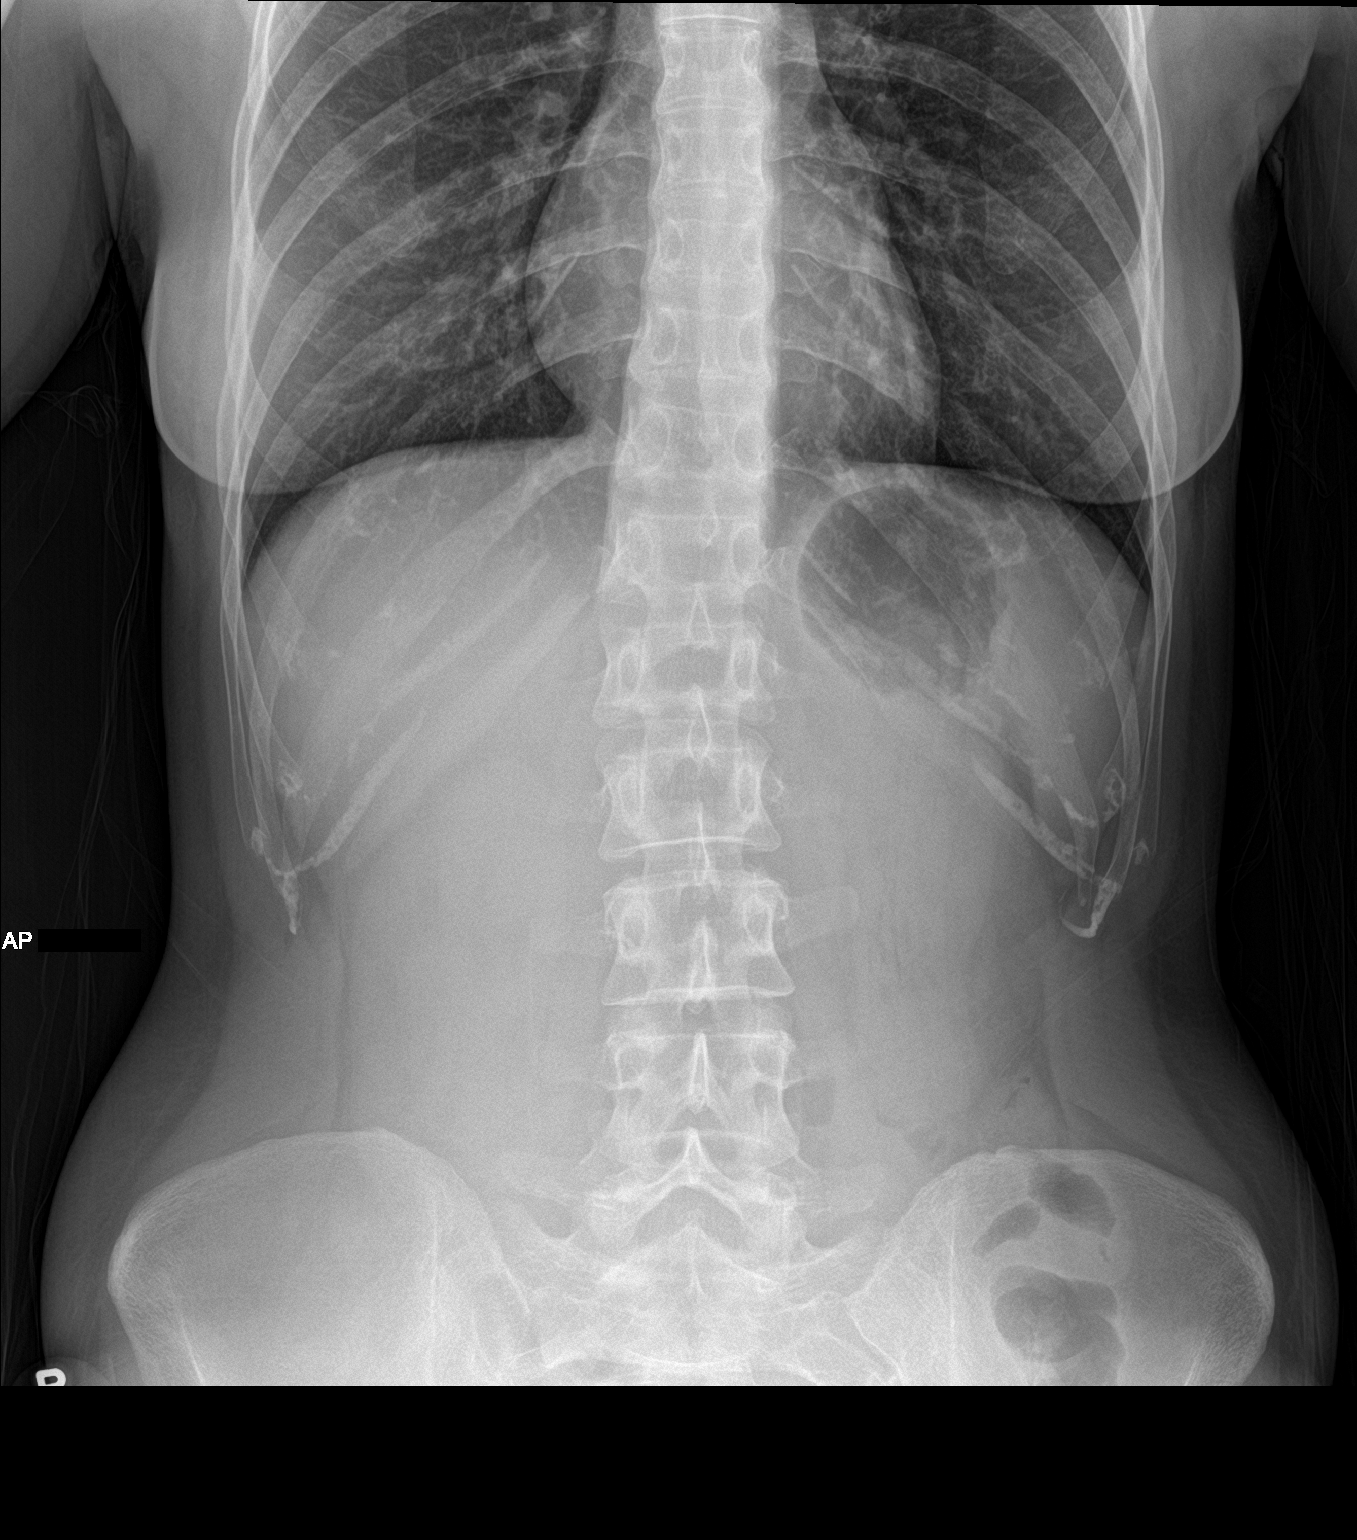

[abdomen supine]
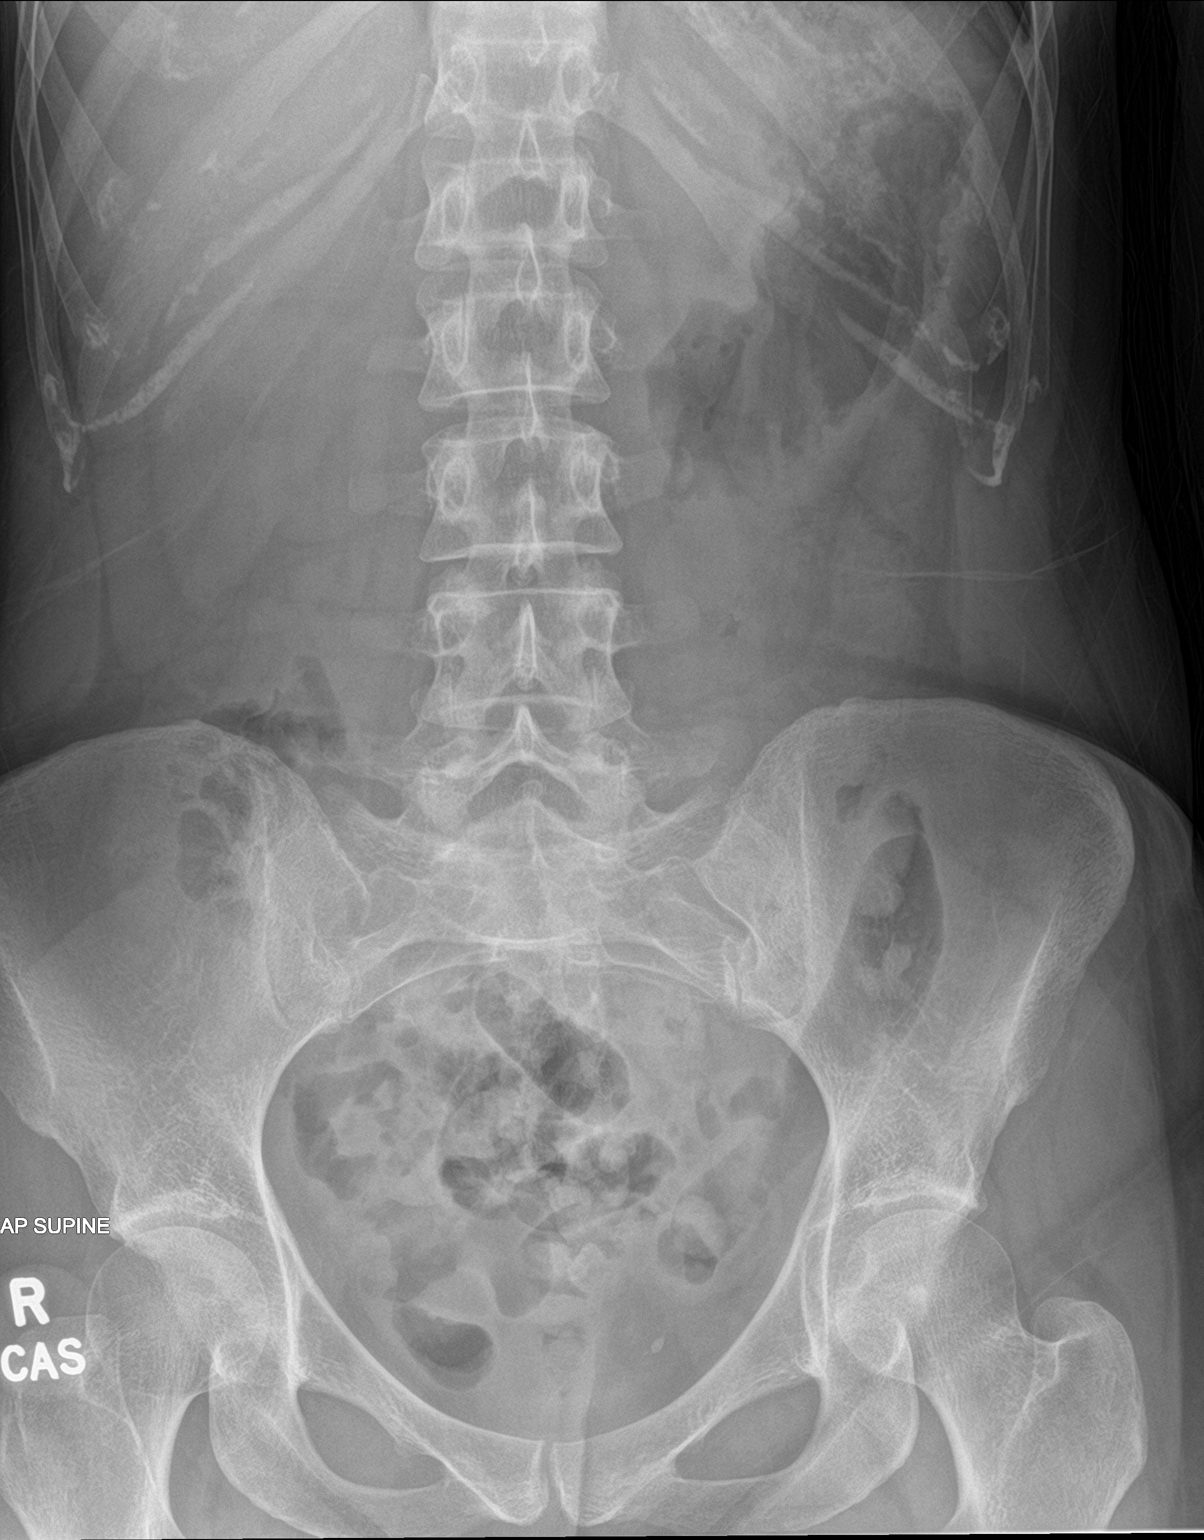

[2 of 2 positions shown; findings below may reference images not displayed]

FINDINGS: Low stool volume with only mild stool seen at the level of the
rectum and distal sigmoid. No dilated bowel or fluid levels. No
concerning mass effect or gas collection. The lung bases are clear.
No osseous findings. Pelvic calcifications are phleboliths by CT.
IMPRESSION: Normal exam.  No fecal loading.

## 2020-11-23 ENCOUNTER — Emergency Department (HOSPITAL_COMMUNITY): Payer: Self-pay

## 2020-11-23 ENCOUNTER — Other Ambulatory Visit: Payer: Self-pay

## 2020-11-23 ENCOUNTER — Encounter (HOSPITAL_COMMUNITY): Payer: Self-pay | Admitting: *Deleted

## 2020-11-23 ENCOUNTER — Emergency Department (HOSPITAL_COMMUNITY)
Admission: EM | Admit: 2020-11-23 | Discharge: 2020-11-23 | Discharge status: Against Medical Advice | Payer: Self-pay | Attending: Emergency Medicine | Admitting: Emergency Medicine

## 2020-11-23 DIAGNOSIS — Z20822 Contact with and (suspected) exposure to covid-19: Secondary | ICD-10-CM | POA: Insufficient documentation

## 2020-11-23 DIAGNOSIS — F17210 Nicotine dependence, cigarettes, uncomplicated: Secondary | ICD-10-CM | POA: Insufficient documentation

## 2020-11-23 DIAGNOSIS — L02412 Cutaneous abscess of left axilla: Secondary | ICD-10-CM | POA: Insufficient documentation

## 2020-11-23 DIAGNOSIS — L02419 Cutaneous abscess of limb, unspecified: Secondary | ICD-10-CM

## 2020-11-23 LAB — CBC WITH DIFFERENTIAL/PLATELET
Abs Immature Granulocytes: 0.05 10*3/uL (ref 0.00–0.07)
Basophils Absolute: 0.1 10*3/uL (ref 0.0–0.1)
Basophils Relative: 1 %
Eosinophils Absolute: 0.2 10*3/uL (ref 0.0–0.5)
Eosinophils Relative: 1 %
HCT: 41.8 % (ref 36.0–46.0)
Hemoglobin: 13.8 g/dL (ref 12.0–15.0)
Immature Granulocytes: 0 %
Lymphocytes Relative: 20 %
Lymphs Abs: 3.1 10*3/uL (ref 0.7–4.0)
MCH: 31.1 pg (ref 26.0–34.0)
MCHC: 33 g/dL (ref 30.0–36.0)
MCV: 94.1 fL (ref 80.0–100.0)
Monocytes Absolute: 1 10*3/uL (ref 0.1–1.0)
Monocytes Relative: 7 %
Neutro Abs: 11 10*3/uL — ABNORMAL HIGH (ref 1.7–7.7)
Neutrophils Relative %: 71 %
Platelets: 386 10*3/uL (ref 150–400)
RBC: 4.44 MIL/uL (ref 3.87–5.11)
RDW: 12.1 % (ref 11.5–15.5)
WBC: 15.4 10*3/uL — ABNORMAL HIGH (ref 4.0–10.5)
nRBC: 0 % (ref 0.0–0.2)

## 2020-11-23 LAB — BASIC METABOLIC PANEL
Anion gap: 5 (ref 5–15)
BUN: 15 mg/dL (ref 6–20)
CO2: 27 mmol/L (ref 22–32)
Calcium: 8.4 mg/dL — ABNORMAL LOW (ref 8.9–10.3)
Chloride: 103 mmol/L (ref 98–111)
Creatinine, Ser: 0.54 mg/dL (ref 0.44–1.00)
GFR, Estimated: >60 mL/min (ref >60)
Glucose, Bld: 63 mg/dL — ABNORMAL LOW (ref 70–99)
Potassium: 3 mmol/L — ABNORMAL LOW (ref 3.5–5.1)
Sodium: 135 mmol/L (ref 135–145)

## 2020-11-23 LAB — RESP PANEL BY RT-PCR (FLU A&B, COVID) ARPGX2
Influenza A by PCR: NEGATIVE
Influenza B by PCR: NEGATIVE
SARS Coronavirus 2 by RT PCR: NEGATIVE

## 2020-11-23 MED ORDER — SODIUM CHLORIDE 0.9 % IV BOLUS
500.0000 mL | Freq: Once | INTRAVENOUS | Status: AC
  Administered 2020-11-23: 500 mL via INTRAVENOUS

## 2020-11-23 MED ORDER — ONDANSETRON HCL 4 MG/2ML IJ SOLN
4.0000 mg | Freq: Once | INTRAMUSCULAR | Status: AC
  Administered 2020-11-23: 4 mg via INTRAVENOUS
  Filled 2020-11-23: qty 2

## 2020-11-23 MED ORDER — LIDOCAINE-EPINEPHRINE (PF) 2 %-1:200000 IJ SOLN: 20.0000 mL | Freq: Once | INTRAMUSCULAR | Status: DC

## 2020-11-23 MED ORDER — ETOMIDATE 2 MG/ML IV SOLN: 0.1500 mg/kg | Freq: Once | INTRAVENOUS | Status: DC

## 2020-11-23 MED ORDER — HYDROMORPHONE HCL 1 MG/ML IJ SOLN
1.0000 mg | INTRAMUSCULAR | Status: DC | PRN
  Administered 2020-11-23 (×2): 1 mg via INTRAVENOUS
  Filled 2020-11-23 (×2): qty 1

## 2020-11-23 MED ORDER — IOHEXOL 350 MG/ML SOLN
100.0000 mL | Freq: Once | INTRAVENOUS | Status: AC | PRN
  Administered 2020-11-23: 100 mL via INTRAVENOUS

## 2020-11-23 NOTE — ED Provider Notes (Signed)
Emergency Medicine Provider Triage Evaluation Note  Karen Cantu , a 35 y.o. female  was evaluated in triage.  Pt complains of pain left axilla from boils.  She has a history of chronic pain, and daily MME 40.  Review of Systems  Positive: Pain left axilla Negative: Vomiting, weakness  Physical Exam  BP 118/84 (BP Location: Right Arm)   Pulse 91   Temp 98 F (36.7 C) (Oral)   Resp (!) 22   Ht 5\' 5"  (1.651 m)   Wt 52.2 kg   LMP  (Within Months)   SpO2 100%   BMI 19.14 kg/m  Gen:   Awake, crying, splinting, uncomfortable Resp:  Normal effort  MSK:   Moves extremities without difficulty unable to move left arm secondary to left axilla pain. Other:  Left axilla with fluctuance, redness, swelling, consistent with irregular abscess versus multiple abscesses.  Medical Decision Making  Medically screening exam initiated at 4:30 PM.  Appropriate orders placed.  Karen Cantu was informed that the remainder of the evaluation will be completed by another provider, this initial triage assessment does not replace that evaluation, and the importance of remaining in the ED until their evaluation is complete.  Nursing asked to keep patient in treatment/observed area, not back to the waiting room.   Daleen Bo, MD 11/23/20 [id number]

## 2020-11-23 NOTE — ED Provider Notes (Signed)
[facility] EMERGENCY DEPARTMENT Provider Note   CSN: [id number] Arrival date & time: 11/23/20  1543     History Chief Complaint  Patient presents with   Abscess    Karen Cantu is a 35 y.o. female.  HPI She presents for evaluation of swelling and drainage from left arm wounds.  She states she first noted to areas of swelling, several days ago, tried to squeeze some pus out of it and then things got worse.  She denies fever, chills, nausea or vomiting.  No prior similar problems.  On arrival she had severe pain and required empiric treatment.  There are no other known active modifying factors.    Past Medical History:  Diagnosis Date   Attention deficit disorder    B12 deficiency    Dysrhythmia    increased heartrate, scheduled to see cardiology; always over 90-100.   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   Abnormal weight loss 07/04/2016   Abdominal pain, epigastric 07/04/2016   RLQ abdominal pain 07/04/2016   Encounter for annual physical exam 02/03/2015   Hiatal hernia    Gastroesophageal reflux disease without esophagitis    GERD (gastroesophageal reflux disease) 11/07/2014   Dysphagia, pharyngoesophageal phase 11/07/2014   Abnormal ECG 12/17/2013   SOB (shortness of breath) 12/17/2013   Dyspnea on exertion 12/17/2013   SVT (supraventricular tachycardia) ([place]) 12/17/2013   Atypical chest pain 12/17/2013   Contraception 04/08/2013   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[place]) 10/05/2012   NAUSEA 03/29/2010   Abdominal pain 03/29/2010   Constipation 05/17/2009   HERPES ZOSTER 03/31/2009   MIGRAINE HEADACHE 03/31/2009   IRRITABLE BOWEL SYNDROME 03/31/2009    Past Surgical History:  Procedure  Laterality Date   COLONOSCOPY  04/09/2007   [id number] rectum, colon, terminal ileum   COLONOSCOPY WITH PROPOFOL N/A 08/17/2017   Procedure: COLONOSCOPY WITH PROPOFOL;  Surgeon: Daneil Dolin, MD;  [facility];  Service: Endoscopy;  Laterality: N/A;  2:15pm   ESOPHAGEAL DILATION N/A 11/26/2014   Procedure: ESOPHAGEAL DILATION;  Surgeon: Daneil Dolin, MD;  [facility];  Service: Endoscopy;  Laterality: N/A;   ESOPHAGOGASTRODUODENOSCOPY  2012   Dr. Gala Romney: normal    ESOPHAGOGASTRODUODENOSCOPY N/A 11/26/2014   Procedure: ESOPHAGOGASTRODUODENOSCOPY (EGD);  Surgeon: Daneil Dolin, MD;  [facility];  Service: Endoscopy;  Laterality: N/A;  945 - moved to 10/5 @ 9:15   POLYPECTOMY  08/17/2017   Procedure: POLYPECTOMY;  Surgeon: Daneil Dolin, MD;  [facility];  Service: Endoscopy;;  descending colon polyp cs   TOOTH EXTRACTION       OB History     Gravida  0   Para  0   Term  0   Preterm  0   AB  0   Living  0      SAB  0   IAB  0   Ectopic  0   Multiple  0   Live Births  0           Family History  Problem Relation Age of Onset   Anxiety disorder Mother    Colon polyps Mother  Anxiety disorder Father    Anxiety disorder Brother    Colon cancer Maternal Grandmother     Social History   Tobacco Use   Smoking status: Every Day    Packs/day: 0.50    Years: 30.00    Pack years: 15.00    Types: Cigarettes   Smokeless tobacco: Never  Vaping Use   Vaping Use: Never used  Substance Use Topics   Alcohol use: Not Currently    Comment: 4th of July   Drug use: No    Home Medications Prior to Admission medications   Medication Sig Start Date End Date Taking? Authorizing Provider  ALPRAZolam [name]) 1 MG tablet Take 1 mg by mouth 2 (two) times daily. 06/07/17  Yes [provider]  amphetamine-dextroamphetamine (ADDERALL) 20 MG tablet Take 20 mg by mouth See admin instructions. Take 1 capsule (20 mg) by  mouth in the morning on Mondays through Fridays ONLY 06/07/17  Yes [provider]  Aspirin-Acetaminophen (GOODYS BODY PAIN PO) Take 1 packet by mouth daily as needed (pain).   Yes [provider]  cyanocobalamin (,VITAMIN B-12,) 1000 MCG/ML injection Inject 1,000 mcg into the muscle See admin instructions. INJECT 1000 MCG INTRAMUSCULARLY TWICE A MONTH   Yes [provider]  HYDROcodone-acetaminophen (NORCO) 10-325 MG per tablet Take 0.5-1 tablets by mouth 2 (two) times daily as needed (for pain.).    Yes [provider]  amphetamine-dextroamphetamine (ADDERALL) 30 MG tablet  05/30/19   [provider]  aspirin-acetaminophen-caffeine (EXCEDRIN MIGRAINE) [phone] MG tablet Take 1 tablet by mouth every 6 (six) hours as needed for headache. Patient not taking: Reported on 11/23/2020    [provider]  cyclobenzaprine (FLEXERIL) 10 MG tablet Take 10 mg by mouth 3 (three) times daily as needed for muscle spasms. Patient not taking: Reported on 11/23/2020    [provider]  dexlansoprazole (DEXILANT) 60 MG capsule Take 1 capsule (60 mg total) by mouth daily. Patient not taking: Reported on 11/23/2020 11/28/16   Carlis Stable, NP  KAOPECTATE 262 MG/15ML suspension Take 15-30 mLs by mouth every 6 (six) hours as needed (for upset stomach). Kaopectate Patient not taking: Reported on 11/23/2020    [provider]  linaclotide (LINZESS) 290 MCG CAPS capsule Take 290 mcg by mouth daily as needed (FOR SEVERE CONSTIPATION. (ONLY ON WEEKENDS)).  Patient not taking: Reported on 11/23/2020    [provider]  linaclotide Rolan Lipa) 72 MCG capsule Take 1 capsule (72 mcg total) by mouth daily before breakfast. Patient not taking: Reported on 11/23/2020 06/30/17   Carlis Stable, NP  medroxyPROGESTERone Acetate 150 MG/ML SUSY INJECT 1 ML INTRAMUSCULARLY EVERY 3 MONTHS Patient not taking: Reported on 11/23/2020 12/23/19   Florian Buff, MD  Multiple  Vitamins-Minerals (ADULT GUMMY PO) Take 1 tablet by mouth 2 (two) times daily. Patient not taking: Reported on 11/23/2020    [provider]  naproxen sodium (ALEVE) 220 MG tablet Take 660 mg by mouth 2 (two) times daily as needed (FOR INFLAMMATION.).  Patient not taking: Reported on 11/23/2020    [provider]  potassium chloride (K-DUR) 10 MEQ tablet Take 2 tablets (20 mEq total) by mouth 2 (two) times daily for 3 days. Patient not taking: Reported on 11/23/2020 08/14/17 08/17/17  Annitta Needs, NP    Allergies    Penicillins and Sumatriptan  Review of Systems   Review of Systems  All other systems reviewed and are negative.  Physical Exam Updated Vital Signs BP Marland Kitchen)  135/91   Pulse 87   Temp 98 F (36.7 C) (Oral)   Resp 17   Ht 5\' 5"  (1.651 m)   Wt 52.2 kg   LMP 11/16/2020 (Within Days)   SpO2 100%   BMI 19.14 kg/m   Physical Exam Vitals and nursing note reviewed.  Constitutional:      General: She is in acute distress.     Appearance: She is well-developed. She is not ill-appearing, toxic-appearing or diaphoretic.  HENT:     Head: Normocephalic and atraumatic.  Eyes:     Conjunctiva/sclera: Conjunctivae normal.     Pupils: Pupils are equal, round, and reactive to light.  Neck:     Trachea: Phonation normal.  Cardiovascular:     Rate and Rhythm: Normal rate.  Pulmonary:     Effort: Pulmonary effort is normal.  Chest:     Chest wall: No tenderness.  Abdominal:     General: There is no distension.  Musculoskeletal:        General: Normal range of motion.     Cervical back: Normal range of motion and neck supple.     Comments: She guards against any movement of the left shoulder secondary to severe left axilla pain.  Skin:    General: Skin is warm and dry.     Comments: Several masses, left axilla, 2 to 3 cm, raised at least 2 cm, and draining purulent material.  Neurological:     Mental Status: She is alert and oriented to person, place, and  time.     Motor: No abnormal muscle tone.  Psychiatric:        Mood and Affect: Mood normal.        Behavior: Behavior normal.        Thought Content: Thought content normal.        Judgment: Judgment normal.    ED Results / Procedures / Treatments   Labs (all labs ordered are listed, but only abnormal results are displayed) Labs Reviewed  BASIC METABOLIC PANEL - Abnormal; Notable for the following components:      Result Value   Potassium 3.0 (*)    Glucose, Bld 63 (*)    Calcium 8.4 (*)    All other components within normal limits  CBC WITH DIFFERENTIAL/PLATELET - Abnormal; Notable for the following components:   WBC 15.4 (*)    Neutro Abs 11.0 (*)    All other components within normal limits  RESP PANEL BY RT-PCR (FLU A&B, COVID) [id number]    EKG None  Radiology DG Chest 2 View  Result Date: 11/23/2020 CLINICAL DATA:  Patient reports 3-4 abscesses in the left axilla. EXAM: CHEST - 2 VIEW COMPARISON:  Chest radiograph 08/02/2007 FINDINGS: Of note, the patient is mildly rotated on this examination. Subtle patchy airspace opacity overlies the right lung base. An additional somewhat nodular patchy airspace opacity is noted in the left lung base. No pleural effusion or pneumothorax. Heart is normal in size. No acute osseous abnormality. No subcutaneous emphysema overlying the left axilla at site of reported abscesses. IMPRESSION: Subtle patchy airspace opacities in the lower lobes may represent atypical infection, including viral etiologies. Electronically Signed   By: Ileana Roup M.D.   On: 11/23/2020 17:21   CT ANGIO UP EXTREM LEFT W &/OR WO CONTAST  Result Date: 11/23/2020 CLINICAL DATA:  Left axillary abscesses with drainage, initial encounter EXAM: CT ANGIOGRAPHY OF THE LEFT UPPEREXTREMITY TECHNIQUE: Multidetector CT imaging of the left upper extremitywas performed using the  standard protocol during bolus administration of intravenous contrast. Multiplanar CT image  reconstructions and MIPs were obtained to evaluate the vascular anatomy. CONTRAST:  189mL OMNIPAQUE IOHEXOL 350 MG/ML SOLN COMPARISON:  None. FINDINGS: Vascular: The left axillary and visualized portions of the distal left subclavian artery are within normal limits. Left brachial artery is unremarkable. Normal bifurcation is seen. Radial and ulnar arteries as well as the interosseous artery are noted to the level of the wrist. Nonvascular: In the left axilla, there are multiple peripherally enhancing fluid collections identified consistent with the given clinical history these range in size from 1.4 to 2.2 cm. No air is noted within. Adjacent skin thickening is noted consistent with localized inflammatory change. These are removed from the artery and venous structures. No significant lymphadenopathy is identified. The remainder of the visualized soft tissues appear within normal limits. The bony structures are unremarkable.  No erosive changes are seen. Review of the MIP images confirms the above findings. IMPRESSION: No vascular abnormality is identified. Four peripherally enhancing fluid collections identified in the left axilla which correspond With the given clinical history and represent abscesses. Mild associated skin thickening is noted. No other focal abnormality is noted. Electronically Signed   By: Inez Catalina M.D.   On: 11/23/2020 19:20    Procedures .Critical Care Performed by: Daleen Bo, MD Authorized by: Daleen Bo, MD   Critical care provider statement:    Critical care time (minutes):  35   Critical care start time:  11/23/2020 8:15 PM   Critical care end time:  11/23/2020 10:00 PM   Critical care time was exclusive of:  Separately billable procedures and treating other patients   Critical care was necessary to treat or prevent imminent or life-threatening deterioration of the following conditions:  Sepsis   Critical care was time spent personally by me on the following  activities:  Blood draw for specimens, development of treatment plan with patient or surrogate, discussions with consultants, evaluation of patient's response to treatment, examination of patient, obtaining history from patient or surrogate, ordering and performing treatments and interventions, ordering and review of laboratory studies, pulse oximetry, re-evaluation of patient's condition, review of old charts and ordering and review of radiographic studies   Medications Ordered in ED Medications  HYDROmorphone (DILAUDID) injection 1 mg (1 mg Intravenous Given 11/23/20 1944)  sodium chloride 0.9 % bolus 500 mL (0 mLs Intravenous Stopped 11/23/20 1902)  ondansetron (ZOFRAN) injection 4 mg (4 mg Intravenous Given 11/23/20 1710)  iohexol (OMNIPAQUE) 350 MG/ML injection 100 mL (100 mLs Intravenous Contrast Given 11/23/20 1838)    ED Course  I have reviewed the triage vital signs and the nursing notes.  Pertinent labs & imaging results that were available during my care of the patient were reviewed by me and considered in my medical decision making (see chart for details).    MDM Rules/Calculators/A&P                            Patient Vitals for the past 24 hrs:  BP Temp Temp src Pulse Resp SpO2 Height Weight  11/23/20 1937 -- -- -- 87 17 100 % -- --  11/23/20 1933 (!) 135/91 -- -- 99 17 98 % -- --  11/23/20 1620 -- -- -- -- -- -- 5\' 5"  (1.651 m) 52.2 kg  11/23/20 1615 118/84 98 F (36.7 C) Oral 91 (!) 22 100 % -- --   9:45 PM-preparations were made for I&D  left axilla abscesses, under procedural sedation however, at this time, patient left AGAINST MEDICAL ADVICE.  Medical Decision Making:  This patient is presenting for evaluation of left axilla infection, which does require a range of treatment options, and is a complaint that involves a moderate risk of morbidity and mortality. The differential diagnoses include abscess, deep tissue infection, sepsis. I decided to review old records, and  in summary patient apparently with new onset problem left axilla, with abscesses.  Worsening.  Provide treatment at home.  I did not require additional historical information from anyone.  Clinical Laboratory Tests Ordered, included CBC, Metabolic panel, and viral panel . Review indicates normal except white count high, potassium low, glucose low, calcium low. Radiologic Tests Ordered, included CT left,.  I independently Visualized: Radiographic images, which show superficial axillary abscesses   Critical Interventions-clinical evaluation, laboratory testing, radiography, medication treatment, observation and reassessment  After These Interventions, the Patient was reevaluated and was found with abscesses requiring drainage.  At first the patient left AGAINST MEDICAL ADVICE prior to completion of treatment.  CRITICAL CARE-yes Performed by: Daleen Bo  Nursing Notes Reviewed/ Care Coordinated Applicable Imaging Reviewed Interpretation of Laboratory Data incorporated into ED treatment  [place]    Final Clinical Impression(s) / ED Diagnoses Final diagnoses:  None    Rx / DC Orders ED Discharge Orders     None        Daleen Bo, MD 11/24/20 [id number]

## 2020-11-23 NOTE — ED Triage Notes (Signed)
Pt c/o 3-4 abscesses to left axilla that started a few days ago. Pt reports it started with just one and has now progressed to 3 or 4. Family reports 2 of them are draining. Pt c/o extreme pain.

## 2020-11-23 NOTE — ED Notes (Signed)
Pt left department, staff stopped her in the hallway and removed her IV. Security escorted pt back to dept to sign AMA form and then left department ambulatory.

## 2022-07-04 ENCOUNTER — Encounter: Payer: Self-pay | Admitting: *Deleted

## 2022-08-20 ENCOUNTER — Other Ambulatory Visit: Payer: Self-pay

## 2022-08-20 ENCOUNTER — Emergency Department (HOSPITAL_COMMUNITY)
Admission: EM | Admit: 2022-08-20 | Discharge: 2022-08-20 | Disposition: A | Discharge status: Home / Self Care | Payer: Medicaid Other | Attending: Emergency Medicine | Admitting: Emergency Medicine

## 2022-08-20 ENCOUNTER — Encounter (HOSPITAL_COMMUNITY): Payer: Self-pay

## 2022-08-20 DIAGNOSIS — L0231 Cutaneous abscess of buttock: Secondary | ICD-10-CM | POA: Diagnosis present

## 2022-08-20 LAB — BASIC METABOLIC PANEL
Anion gap: 6 (ref 5–15)
BUN: 9 mg/dL (ref 6–20)
CO2: 26 mmol/L (ref 22–32)
Calcium: 8.4 mg/dL — ABNORMAL LOW (ref 8.9–10.3)
Chloride: 105 mmol/L (ref 98–111)
Creatinine, Ser: 0.82 mg/dL (ref 0.44–1.00)
GFR, Estimated: >60 mL/min (ref >60)
Glucose, Bld: 92 mg/dL (ref 70–99)
Potassium: 3.2 mmol/L — ABNORMAL LOW (ref 3.5–5.1)
Sodium: 137 mmol/L (ref 135–145)

## 2022-08-20 LAB — CBC WITH DIFFERENTIAL/PLATELET
Abs Immature Granulocytes: 0.05 10*3/uL (ref 0.00–0.07)
Basophils Absolute: 0.1 10*3/uL (ref 0.0–0.1)
Basophils Relative: 0 %
Eosinophils Absolute: 0.1 10*3/uL (ref 0.0–0.5)
Eosinophils Relative: 1 %
HCT: 37.1 % (ref 36.0–46.0)
Hemoglobin: 12.4 g/dL (ref 12.0–15.0)
Immature Granulocytes: 0 %
Lymphocytes Relative: 14 %
Lymphs Abs: 2.1 10*3/uL (ref 0.7–4.0)
MCH: 30.4 pg (ref 26.0–34.0)
MCHC: 33.4 g/dL (ref 30.0–36.0)
MCV: 90.9 fL (ref 80.0–100.0)
Monocytes Absolute: 0.9 10*3/uL (ref 0.1–1.0)
Monocytes Relative: 6 %
Neutro Abs: 11.8 10*3/uL — ABNORMAL HIGH (ref 1.7–7.7)
Neutrophils Relative %: 79 %
Platelets: 278 10*3/uL (ref 150–400)
RBC: 4.08 MIL/uL (ref 3.87–5.11)
RDW: 12.8 % (ref 11.5–15.5)
WBC: 14.9 10*3/uL — ABNORMAL HIGH (ref 4.0–10.5)
nRBC: 0 % (ref 0.0–0.2)

## 2022-08-20 MED ORDER — DIAZEPAM 5 MG/ML IJ SOLN
2.5000 mg | Freq: Once | INTRAMUSCULAR | Status: DC
  Filled 2022-08-20: qty 2

## 2022-08-20 MED ORDER — SODIUM CHLORIDE 0.9 % IV BOLUS
1000.0000 mL | Freq: Once | INTRAVENOUS | Status: AC
  Administered 2022-08-20: 1000 mL via INTRAVENOUS

## 2022-08-20 MED ORDER — DOXYCYCLINE HYCLATE 100 MG PO CAPS: 100.0000 mg | ORAL_CAPSULE | Freq: Two times a day (BID) | ORAL | 0 refills | Status: DC

## 2022-08-20 MED ORDER — DOXYCYCLINE HYCLATE 100 MG PO TABS
100.0000 mg | ORAL_TABLET | Freq: Once | ORAL | Status: AC
  Administered 2022-08-20: 100 mg via ORAL
  Filled 2022-08-20: qty 1

## 2022-08-20 MED ORDER — MORPHINE SULFATE (PF) 4 MG/ML IV SOLN
4.0000 mg | Freq: Once | INTRAVENOUS | Status: AC
  Administered 2022-08-20: 4 mg via INTRAVENOUS
  Filled 2022-08-20: qty 1

## 2022-08-20 MED ORDER — DIAZEPAM 5 MG/ML IJ SOLN
INTRAMUSCULAR | Status: AC
  Administered 2022-08-20: 2.5 mg
  Filled 2022-08-20: qty 2

## 2022-08-20 MED ORDER — LIDOCAINE-EPINEPHRINE (PF) 2 %-1:200000 IJ SOLN
10.0000 mL | Freq: Once | INTRAMUSCULAR | Status: AC
  Administered 2022-08-20: 10 mL
  Filled 2022-08-20: qty 20

## 2022-08-20 MED ORDER — HYDROCODONE-ACETAMINOPHEN 5-325 MG PO TABS: 1.0000 | ORAL_TABLET | ORAL | 0 refills | Status: DC | PRN

## 2022-08-20 MED ORDER — ONDANSETRON HCL 4 MG/2ML IJ SOLN
4.0000 mg | Freq: Once | INTRAMUSCULAR | Status: AC
  Administered 2022-08-20: 4 mg via INTRAVENOUS
  Filled 2022-08-20: qty 2

## 2022-08-20 MED ORDER — IBUPROFEN 600 MG PO TABS: 600.0000 mg | ORAL_TABLET | Freq: Four times a day (QID) | ORAL | 0 refills | Status: DC | PRN

## 2022-08-20 NOTE — ED Notes (Signed)
Pt placed in room Pt undressing from waist down at this time  Family at bedside

## 2022-08-20 NOTE — ED Provider Notes (Signed)
Karen Cantu   CSN: [id number] Arrival date & time: 08/20/22  2000     History  Chief Complaint  Patient presents with   Abscess    Left buttock    Karen Cantu is a 37 y.o. female.  Pt is a 37 yo female with pmhx significant for adhd, anxiety, ibs, gerd, and migraines.  Pt noticed a possible spider bite to right buttock yesterday.  Pt said it has gotten worse and is more painful.       Home Medications Prior to Admission medications   Medication Sig Start Date End Date Taking? Authorizing Provider  doxycycline (VIBRAMYCIN) 100 MG capsule Take 1 capsule (100 mg total) by mouth 2 (two) times daily. 08/20/22  Yes Jacalyn Lefevre, MD  HYDROcodone-acetaminophen (NORCO/VICODIN) 5-325 MG tablet Take 1 tablet by mouth every 4 (four) hours as needed. 08/20/22  Yes Jacalyn Lefevre, MD  ibuprofen (ADVIL) 600 MG tablet Take 1 tablet (600 mg total) by mouth every 6 (six) hours as needed. 08/20/22  Yes Jacalyn Lefevre, MD  ALPRAZolam Prudy Feeler) 1 MG tablet Take 1 mg by mouth 2 (two) times daily. 06/07/17   [provider]  amphetamine-dextroamphetamine (ADDERALL) 20 MG tablet Take 20 mg by mouth See admin instructions. Take 1 capsule (20 mg) by mouth in the morning on Mondays through Fridays ONLY 06/07/17   [provider]  amphetamine-dextroamphetamine (ADDERALL) 30 MG tablet  05/30/19   [provider]  Aspirin-Acetaminophen (GOODYS BODY PAIN PO) Take 1 packet by mouth daily as needed (pain).    [provider]  aspirin-acetaminophen-caffeine (EXCEDRIN MIGRAINE) [phone] MG tablet Take 1 tablet by mouth every 6 (six) hours as needed for headache. Patient not taking: Reported on 11/23/2020    [provider]  cyanocobalamin (,VITAMIN B-12,) 1000 MCG/ML injection Inject 1,000 mcg into the muscle See admin instructions. INJECT 1000 MCG INTRAMUSCULARLY TWICE A MONTH    [provider]   cyclobenzaprine (FLEXERIL) 10 MG tablet Take 10 mg by mouth 3 (three) times daily as needed for muscle spasms. Patient not taking: Reported on 11/23/2020    [provider]  dexlansoprazole (DEXILANT) 60 MG capsule Take 1 capsule (60 mg total) by mouth daily. Patient not taking: Reported on 11/23/2020 11/28/16   Anice Paganini, NP  HYDROcodone-acetaminophen (NORCO) 10-325 MG per tablet Take 0.5-1 tablets by mouth 2 (two) times daily as needed (for pain.).     [provider]  KAOPECTATE 262 MG/15ML suspension Take 15-30 mLs by mouth every 6 (six) hours as needed (for upset stomach). Kaopectate Patient not taking: Reported on 11/23/2020    [provider]  linaclotide (LINZESS) 290 MCG CAPS capsule Take 290 mcg by mouth daily as needed (FOR SEVERE CONSTIPATION. (ONLY ON WEEKENDS)).  Patient not taking: Reported on 11/23/2020    [provider]  linaclotide Karlene Einstein) 72 MCG capsule Take 1 capsule (72 mcg total) by mouth daily before breakfast. Patient not taking: Reported on 11/23/2020 06/30/17   Anice Paganini, NP  medroxyPROGESTERone Acetate 150 MG/ML SUSY INJECT 1 ML INTRAMUSCULARLY EVERY 3 MONTHS Patient not taking: Reported on 11/23/2020 12/23/19   Lazaro Arms, MD  Multiple Vitamins-Minerals (ADULT GUMMY PO) Take 1 tablet by mouth 2 (two) times daily. Patient not taking: Reported on 11/23/2020    [provider]  naproxen sodium (ALEVE) 220 MG tablet Take 660 mg by mouth 2 (two) times daily as needed (FOR INFLAMMATION.).  Patient not taking: Reported  on 11/23/2020    [provider]  potassium chloride (K-DUR) 10 MEQ tablet Take 2 tablets (20 mEq total) by mouth 2 (two) times daily for 3 days. Patient not taking: Reported on 11/23/2020 08/14/17 08/17/17  Gelene Mink, NP      Allergies    Penicillins and Sumatriptan    Review of Systems   Review of Systems  Skin:  Positive for wound.  All other systems reviewed and are negative.   Physical  Exam Updated Vital Signs BP 119/71   Pulse 96   Temp 98.6 F (37 C) (Oral)   Resp 18   SpO2 96%  Physical Exam Vitals and nursing Cantu reviewed.  Constitutional:      Appearance: Normal appearance.  HENT:     Head: Normocephalic and atraumatic.     Right Ear: External ear normal.     Left Ear: External ear normal.     Nose: Nose normal.     Mouth/Throat:     Mouth: Mucous membranes are moist.     Pharynx: Oropharynx is clear.  Eyes:     Extraocular Movements: Extraocular movements intact.     Conjunctiva/sclera: Conjunctivae normal.     Pupils: Pupils are equal, round, and reactive to light.  Cardiovascular:     Rate and Rhythm: Regular rhythm. Tachycardia present.     Pulses: Normal pulses.     Heart sounds: Normal heart sounds.  Pulmonary:     Effort: Pulmonary effort is normal.     Breath sounds: Normal breath sounds.  Abdominal:     General: Abdomen is flat. Bowel sounds are normal.     Palpations: Abdomen is soft.  Musculoskeletal:        General: Normal range of motion.     Cervical back: Normal range of motion and neck supple.  Skin:    General: Skin is warm.     Capillary Refill: Capillary refill takes less than 2 seconds.     Comments: Abscess left buttock  Neurological:     General: No focal deficit present.     Mental Status: She is alert.  Psychiatric:        Mood and Affect: Mood is anxious. Affect is tearful.     ED Results / Procedures / Treatments   Labs (all labs ordered are listed, but only abnormal results are displayed) Labs Reviewed  BASIC METABOLIC PANEL - Abnormal; Notable for the following components:      Result Value   Potassium 3.2 (*)    Calcium 8.4 (*)    All other components within normal limits  CBC WITH DIFFERENTIAL/PLATELET - Abnormal; Notable for the following components:   WBC 14.9 (*)    Neutro Abs 11.8 (*)    All other components within normal limits    EKG None  Radiology No results  found.  Procedures .Marland KitchenIncision and Drainage  Date/Time: 08/20/2022 10:22 PM  Performed by: Jacalyn Lefevre, MD Authorized by: Jacalyn Lefevre, MD   Consent:    Consent obtained:  Verbal   Consent given by:  Patient   Risks, benefits, and alternatives were discussed: yes     Risks discussed:  Pain and incomplete drainage   Alternatives discussed:  No treatment Location:    Type:  Abscess   Location:  Lower extremity   Lower extremity location:  Buttock   Buttock location:  R buttock Pre-procedure details:    Skin preparation:  Chlorhexidine Sedation:    Sedation type:  Anxiolysis Anesthesia:  Anesthesia method:  Local infiltration   Local anesthetic:  Lidocaine 2% WITH epi Procedure type:    Complexity:  Simple Procedure details:    Ultrasound guidance: no     Needle aspiration: no     Incision types:  Cruciate   Wound management:  Probed and deloculated   Drainage:  Purulent   Drainage amount:  Moderate   Wound treatment:  Wound left open Post-procedure details:    Procedure completion:  Tolerated with difficulty     Medications Ordered in ED Medications  diazepam (VALIUM) injection 2.5 mg (2.5 mg Intravenous Not Given 08/20/22 2045)  sodium chloride 0.9 % bolus 1,000 mL (0 mLs Intravenous Stopped 08/20/22 2157)  morphine (PF) 4 MG/ML injection 4 mg (4 mg Intravenous Given 08/20/22 2045)  ondansetron (ZOFRAN) injection 4 mg (4 mg Intravenous Given 08/20/22 2045)  lidocaine-EPINEPHrine (XYLOCAINE W/EPI) 2 %-1:200000 (PF) injection 10 mL (10 mLs Infiltration Given 08/20/22 2157)  diazepam (VALIUM) 5 MG/ML injection (2.5 mg  Given 08/20/22 2112)  doxycycline (VIBRA-TABS) tablet 100 mg (100 mg Oral Given 08/20/22 2158)    ED Course/ Medical Decision Making/ A&P                             Medical Decision Making Amount and/or Complexity of Data Reviewed Labs: ordered.  Risk Prescription drug management.   This patient presents to the ED for concern of abscess,  this involves an extensive number of treatment options, and is a complaint that carries with it a high risk of complications and morbidity.  The differential diagnosis includes abscess, cellulitis   Co morbidities that complicate the patient evaluation  adhd, anxiety, ibs, gerd, and migraines   Additional history obtained:  Additional history obtained from epic chart review External records from outside source obtained and reviewed including sig other   Lab Tests:  I Ordered, and personally interpreted labs.  The pertinent results include:  cbc with wbc elevated at 14.9 and bmp with sl low k at 3.2  Cardiac Monitoring:  The patient was maintained on a cardiac monitor.  I personally viewed and interpreted the cardiac monitored which showed an underlying rhythm of: st initially, now nsr   Medicines ordered and prescription drug management:  I ordered medication including valium and morphine  for anxiety and pain; doxy for infection  Reevaluation of the patient after these medicines showed that the patient improved I have reviewed the patients home medicines and have made adjustments as needed   Problem List / ED Course:  Abscess:  pt required morphine and valium prior to drainage.  She was very anxious during the procedure and could not tolerate a lot of pushing around the abscess.  Wound is now draining.  She is started on doxy.  She is stable for d/c.  Return if worse.    Reevaluation:  After the interventions noted above, I reevaluated the patient and found that they have :improved   Social Determinants of Health:  Lives at home   Dispostion:  After consideration of the diagnostic results and the patients response to treatment, I feel that the patent would benefit from discharge with outpatient f/u.          Final Clinical Impression(s) / ED Diagnoses Final diagnoses:  Abscess of buttock, right    Rx / DC Orders ED Discharge Orders          Ordered     doxycycline (VIBRAMYCIN) 100 MG capsule  2 times daily        08/20/22 2154    HYDROcodone-acetaminophen (NORCO/VICODIN) 5-325 MG tablet  Every 4 hours PRN        08/20/22 2154    ibuprofen (ADVIL) 600 MG tablet  Every 6 hours PRN        08/20/22 2155              Jacalyn Lefevre, MD 08/20/22 2225

## 2022-08-20 NOTE — ED Triage Notes (Signed)
Pt to ED from home with c/o abscess to right buttock, says she woke up yesterday and noted small bump to buttock that progressively got harder, reddened, and more painful.

## 2022-12-25 ENCOUNTER — Emergency Department (HOSPITAL_COMMUNITY)
Admission: EM | Admit: 2022-12-25 | Discharge: 2022-12-25 | Disposition: A | Discharge status: Home / Self Care | Payer: 59 | Attending: Emergency Medicine | Admitting: Emergency Medicine

## 2022-12-25 ENCOUNTER — Other Ambulatory Visit: Payer: Self-pay

## 2022-12-25 ENCOUNTER — Encounter (HOSPITAL_COMMUNITY): Payer: Self-pay | Admitting: Emergency Medicine

## 2022-12-25 DIAGNOSIS — L0231 Cutaneous abscess of buttock: Secondary | ICD-10-CM | POA: Diagnosis not present

## 2022-12-25 LAB — CBC WITH DIFFERENTIAL/PLATELET
Abs Immature Granulocytes: 0.03 10*3/uL (ref 0.00–0.07)
Basophils Absolute: 0.1 10*3/uL (ref 0.0–0.1)
Basophils Relative: 1 %
Eosinophils Absolute: 0.1 10*3/uL (ref 0.0–0.5)
Eosinophils Relative: 1 %
HCT: 41.1 % (ref 36.0–46.0)
Hemoglobin: 13.6 g/dL (ref 12.0–15.0)
Immature Granulocytes: 0 %
Lymphocytes Relative: 21 %
Lymphs Abs: 2.9 10*3/uL (ref 0.7–4.0)
MCH: 30.2 pg (ref 26.0–34.0)
MCHC: 33.1 g/dL (ref 30.0–36.0)
MCV: 91.3 fL (ref 80.0–100.0)
Monocytes Absolute: 0.8 10*3/uL (ref 0.1–1.0)
Monocytes Relative: 6 %
Neutro Abs: 9.9 10*3/uL — ABNORMAL HIGH (ref 1.7–7.7)
Neutrophils Relative %: 71 %
Platelets: 362 10*3/uL (ref 150–400)
RBC: 4.5 MIL/uL (ref 3.87–5.11)
RDW: 12.5 % (ref 11.5–15.5)
WBC: 13.8 10*3/uL — ABNORMAL HIGH (ref 4.0–10.5)
nRBC: 0 % (ref 0.0–0.2)

## 2022-12-25 LAB — BASIC METABOLIC PANEL
Anion gap: 8 (ref 5–15)
BUN: 13 mg/dL (ref 6–20)
CO2: 25 mmol/L (ref 22–32)
Calcium: 8.8 mg/dL — ABNORMAL LOW (ref 8.9–10.3)
Chloride: 103 mmol/L (ref 98–111)
Creatinine, Ser: 0.74 mg/dL (ref 0.44–1.00)
GFR, Estimated: >60 mL/min (ref >60)
Glucose, Bld: 90 mg/dL (ref 70–99)
Potassium: 3.3 mmol/L — ABNORMAL LOW (ref 3.5–5.1)
Sodium: 136 mmol/L (ref 135–145)

## 2022-12-25 MED ORDER — LIDOCAINE HCL (PF) 2 % IJ SOLN
INTRAMUSCULAR | Status: AC
  Administered 2022-12-25: 10 mL
  Filled 2022-12-25: qty 5

## 2022-12-25 MED ORDER — ONDANSETRON HCL 4 MG/2ML IJ SOLN
4.0000 mg | Freq: Once | INTRAMUSCULAR | Status: AC
  Administered 2022-12-25: 4 mg via INTRAVENOUS
  Filled 2022-12-25: qty 2

## 2022-12-25 MED ORDER — DOXYCYCLINE HYCLATE 100 MG PO CAPS: 100.0000 mg | ORAL_CAPSULE | Freq: Two times a day (BID) | ORAL | 0 refills | Status: AC

## 2022-12-25 MED ORDER — LIDOCAINE HCL (PF) 2 % IJ SOLN: 10.0000 mL | Freq: Once | INTRAMUSCULAR | Status: AC

## 2022-12-25 MED ORDER — HYDROMORPHONE HCL 1 MG/ML IJ SOLN
1.0000 mg | Freq: Once | INTRAMUSCULAR | Status: AC
  Administered 2022-12-25: 1 mg via INTRAVENOUS
  Filled 2022-12-25: qty 1

## 2022-12-25 MED ORDER — DOXYCYCLINE HYCLATE 100 MG PO TABS
100.0000 mg | ORAL_TABLET | Freq: Once | ORAL | Status: AC
  Administered 2022-12-25: 100 mg via ORAL
  Filled 2022-12-25: qty 1

## 2022-12-25 NOTE — ED Provider Notes (Signed)
[place] EMERGENCY DEPARTMENT AT [facility] Provider Note   CSN: [id number] Arrival date & time: 12/25/22  0343     History  Chief Complaint  Patient presents with   Abscess    Karen Cantu is a 37 y.o. female.  Presents to the emergency department with concerns over abscess on her right buttock.  Patient reports symptoms began approximately 3 days ago.  Patient complaining of severe pain, swelling, redness without drainage.       Home Medications Prior to Admission medications   Medication Sig Start Date End Date Taking? Authorizing Provider  doxycycline (VIBRAMYCIN) 100 MG capsule Take 1 capsule (100 mg total) by mouth 2 (two) times daily. 12/25/22  Yes Keavon Sensing, Canary Brim, MD  ALPRAZolam Prudy Feeler) 1 MG tablet Take 1 mg by mouth 2 (two) times daily. 06/07/17   [provider]  amphetamine-dextroamphetamine (ADDERALL) 20 MG tablet Take 20 mg by mouth See admin instructions. Take 1 capsule (20 mg) by mouth in the morning on Mondays through Fridays ONLY 06/07/17   [provider]  amphetamine-dextroamphetamine (ADDERALL) 30 MG tablet  05/30/19   [provider]  Aspirin-Acetaminophen (GOODYS BODY PAIN PO) Take 1 packet by mouth daily as needed (pain).    [provider]  aspirin-acetaminophen-caffeine (EXCEDRIN MIGRAINE) [phone] MG tablet Take 1 tablet by mouth every 6 (six) hours as needed for headache. Patient not taking: Reported on 11/23/2020    [provider]  cyanocobalamin (,VITAMIN B-12,) 1000 MCG/ML injection Inject 1,000 mcg into the muscle See admin instructions. INJECT 1000 MCG INTRAMUSCULARLY TWICE A MONTH    [provider]  dexlansoprazole (DEXILANT) 60 MG capsule Take 1 capsule (60 mg total) by mouth daily. Patient not taking: Reported on 11/23/2020 11/28/16   Anice Paganini, NP  HYDROcodone-acetaminophen (NORCO) 10-325 MG per tablet Take 0.5-1 tablets by mouth 2 (two) times daily as needed (for  pain.).     [provider]  medroxyPROGESTERone Acetate 150 MG/ML SUSY INJECT 1 ML INTRAMUSCULARLY EVERY 3 MONTHS Patient not taking: Reported on 11/23/2020 12/23/19   Lazaro Arms, MD      Allergies    Penicillins and Sumatriptan    Review of Systems   Review of Systems  Physical Exam Updated Vital Signs BP 106/85 (BP Location: Right Arm)   Pulse 98   Temp 97.6 F (36.4 C) (Oral)   Resp 20   Ht 5\' 5"  (1.651 m)   Wt 61.2 kg   LMP 12/18/2022 (Approximate)   SpO2 100%   BMI 22.47 kg/m  Physical Exam Vitals and nursing note reviewed.  Constitutional:      General: She is in acute distress.     Appearance: She is well-developed.  HENT:     Head: Normocephalic and atraumatic.     Mouth/Throat:     Mouth: Mucous membranes are moist.  Eyes:     General: Vision grossly intact. Gaze aligned appropriately.     Extraocular Movements: Extraocular movements intact.     Conjunctiva/sclera: Conjunctivae normal.  Cardiovascular:     Rate and Rhythm: Normal rate and regular rhythm.     Pulses: Normal pulses.     Heart sounds: Normal heart sounds, S1 normal and S2 normal. No murmur heard.    No friction rub. No gallop.  Pulmonary:     Effort: Pulmonary effort is normal. No respiratory distress.     Breath sounds: Normal breath sounds.  Abdominal:     General: Bowel sounds are normal.  Palpations: Abdomen is soft.     Tenderness: There is no abdominal tenderness. There is no guarding or rebound.     Hernia: No hernia is present.  Musculoskeletal:        General: No swelling.     Cervical back: Full passive range of motion without pain, normal range of motion and neck supple. No spinous process tenderness or muscular tenderness. Normal range of motion.     Right lower leg: No edema.     Left lower leg: No edema.  Skin:    General: Skin is warm and dry.     Capillary Refill: Capillary refill takes less than 2 seconds.     Findings: Erythema (Fairly well-circumscribed  area of slightly raised erythema central portion of right buttock with a small scab in the center) present. No ecchymosis, rash or wound.  Neurological:     General: No focal deficit present.     Mental Status: She is alert and oriented to person, place, and time.     GCS: GCS eye subscore is 4. GCS verbal subscore is 5. GCS motor subscore is 6.     Cranial Nerves: Cranial nerves 2-12 are intact.     Sensory: Sensation is intact.     Motor: Motor function is intact.     Coordination: Coordination is intact.  Psychiatric:        Attention and Perception: Attention normal.        Mood and Affect: Mood normal.        Speech: Speech normal.        Behavior: Behavior normal.     ED Results / Procedures / Treatments   Labs (all labs ordered are listed, but only abnormal results are displayed) Labs Reviewed  CBC WITH DIFFERENTIAL/PLATELET - Abnormal; Notable for the following components:      Result Value   WBC 13.8 (*)    Neutro Abs 9.9 (*)    All other components within normal limits  BASIC METABOLIC PANEL - Abnormal; Notable for the following components:   Potassium 3.3 (*)    Calcium 8.8 (*)    All other components within normal limits    EKG None  Radiology No results found.  Procedures .Marland KitchenIncision and Drainage  Date/Time: 12/25/2022 6:53 AM  Performed by: Gilda Crease, MD Authorized by: Gilda Crease, MD   Consent:    Consent obtained:  Verbal   Consent given by:  Patient   Risks, benefits, and alternatives were discussed: yes     Risks discussed:  Bleeding, incomplete drainage and pain Universal protocol:    Procedure explained and questions answered to patient or proxy's satisfaction: yes     Site/side marked: yes     Immediately prior to procedure, a time out was called: yes     Patient identity confirmed:  Verbally with patient Location:    Type:  Abscess   Location:  Lower extremity   Lower extremity location:  Buttock   Buttock  location:  R buttock Pre-procedure details:    Skin preparation:  Chlorhexidine Sedation:    Sedation type:  None Anesthesia:    Anesthesia method:  Local infiltration   Local anesthetic:  Lidocaine 2% w/o epi Procedure type:    Complexity:  Simple Procedure details:    Ultrasound guidance: no     Needle aspiration: no     Incision types:  Single straight   Incision depth:  Subcutaneous   Wound management:  Probed and deloculated and  irrigated with saline   Drainage:  Purulent (+ sebaceous)   Drainage amount:  Moderate   Wound treatment:  Wound left open   Packing materials:  None Post-procedure details:    Procedure completion:  Tolerated well, no immediate complications     Medications Ordered in ED Medications  doxycycline (VIBRA-TABS) tablet 100 mg (has no administration in time range)  HYDROmorphone (DILAUDID) injection 1 mg (1 mg Intravenous Given 12/25/22 0510)  ondansetron (ZOFRAN) injection 4 mg (4 mg Intravenous Given 12/25/22 0510)  lidocaine HCl (PF) (XYLOCAINE) 2 % injection 10 mL (10 mLs Infiltration Given by Other 12/25/22 6578)    ED Course/ Medical Decision Making/ A&P                                 Medical Decision Making Amount and/or Complexity of Data Reviewed Labs: ordered.  Risk Prescription drug management.   Differential diagnosis considered includes, but not limited to: Cellulitis; abscess  Patient with erythematous region on right buttock.  This appears to be recurrent.  Patient required algesia for better examination.  Incision and drainage was performed.  There was a mixture of what appeared to be sebaceous material and pus evacuated.  As this is recurrent, suspect there is a cyst, will discharge with antibiotics, sitz bath's and follow-up with surgery.        Final Clinical Impression(s) / ED Diagnoses Final diagnoses:  Abscess of buttock, right    Rx / DC Orders ED Discharge Orders          Ordered    doxycycline  (VIBRAMYCIN) 100 MG capsule  2 times daily        12/25/22 0657              Gilda Crease, MD 12/25/22 [phone]

## 2022-12-25 NOTE — ED Triage Notes (Signed)
Pt c/o abscess to R buttock starting about 4-5 days ago. Pt has large reddened area (larger than size of softball to R buttock). No drainage present. Kellogg RN

## 2023-09-21 IMAGING — CT CT ANGIO EXTREM UP*L*
2 of 5 series · 11 of 46 positions shown, 12 images · IV contrast (omnipaque)
Comparison: None.

CLINICAL DATA: Left axillary abscesses with drainage, initial
encounter

EXAM:
CT ANGIOGRAPHY OF THE LEFT UPPEREXTREMITY
TECHNIQUE: Multidetector CT imaging of the left upper extremitywas performed
using the standard protocol during bolus administration of
intravenous contrast. Multiplanar CT image reconstructions and MIPs
were obtained to evaluate the vascular anatomy.
CONTRAST:  100mL OMNIPAQUE IOHEXOL 350 MG/ML SOLN

[Series 6: axial arterial · axial · arterial · 0.49mm/px · z∈[-920,-314]mm · 8 of 232 slices shown, 9 images]
[im 15/232  soft-tissue]
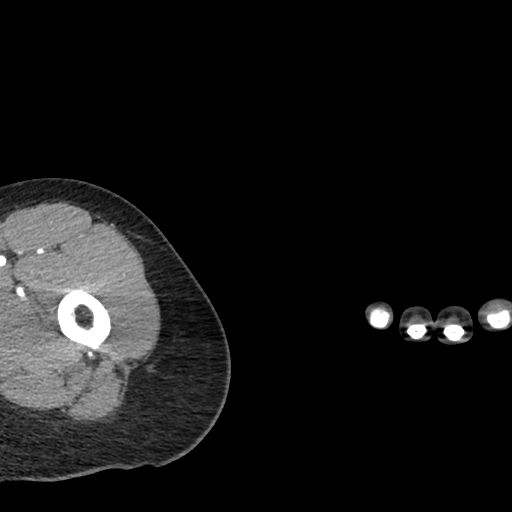
[im 15/232  bone]
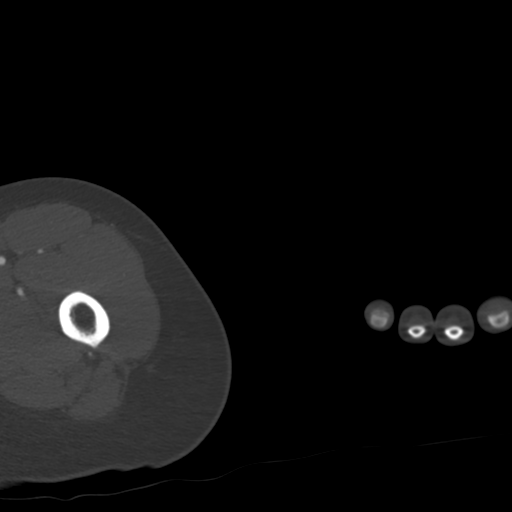
[im 45/232  soft-tissue]
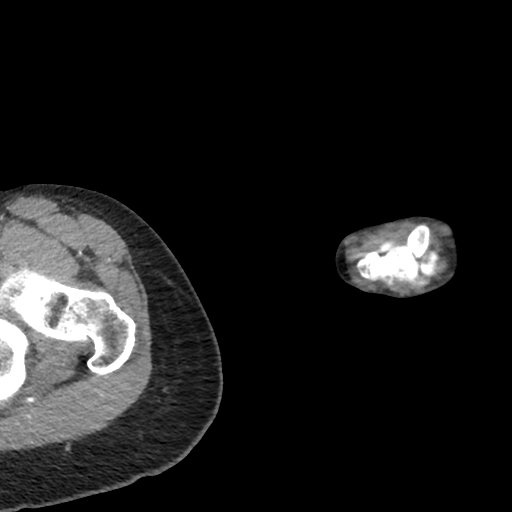
[im 75/232  soft-tissue]
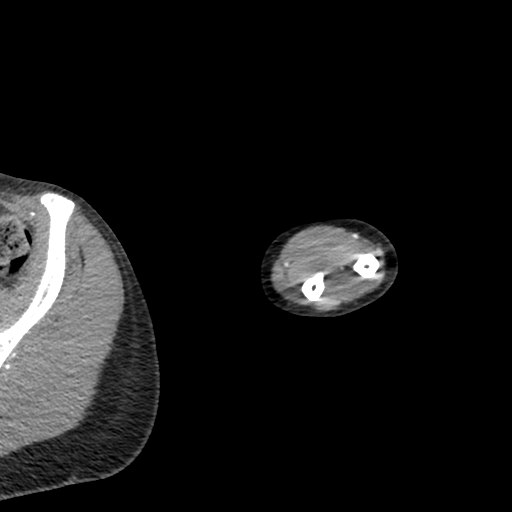
[im 105/232  soft-tissue]
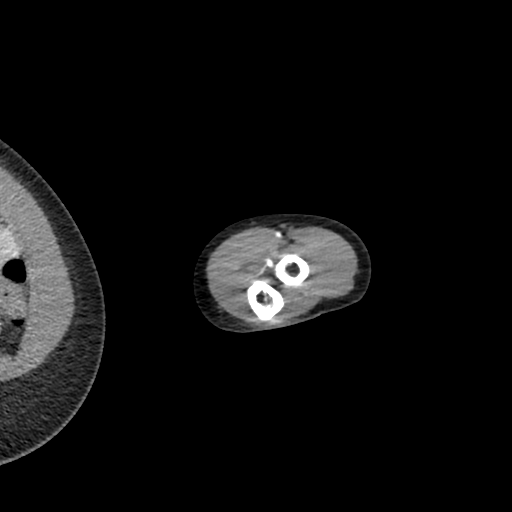
[im 127/232  soft-tissue]
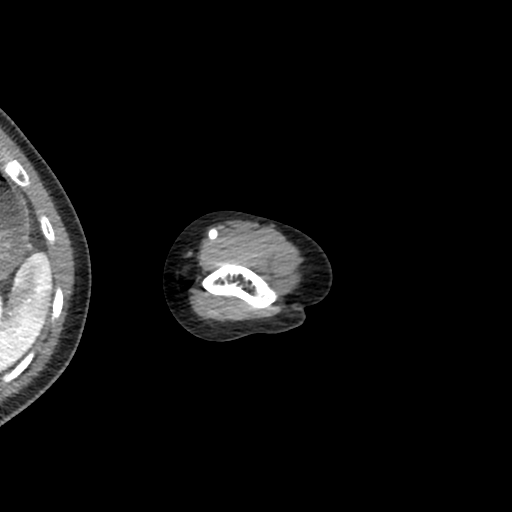
[im 157/232  soft-tissue]
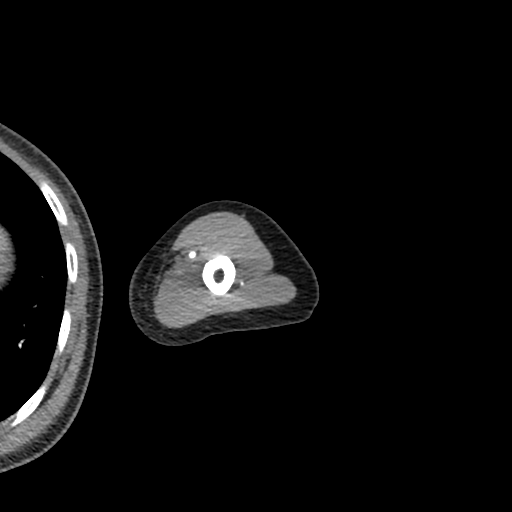
[im 187/232  soft-tissue]
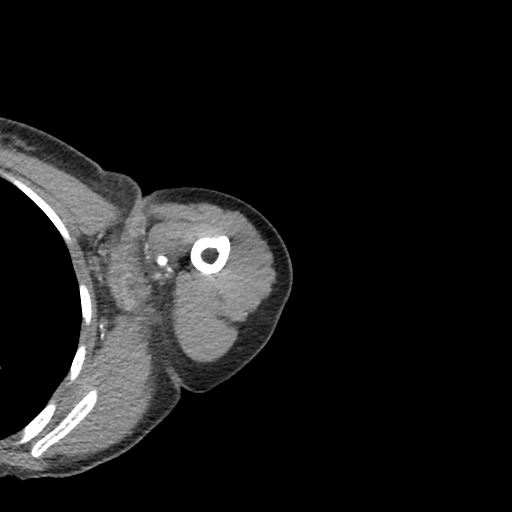
[im 217/232  soft-tissue]
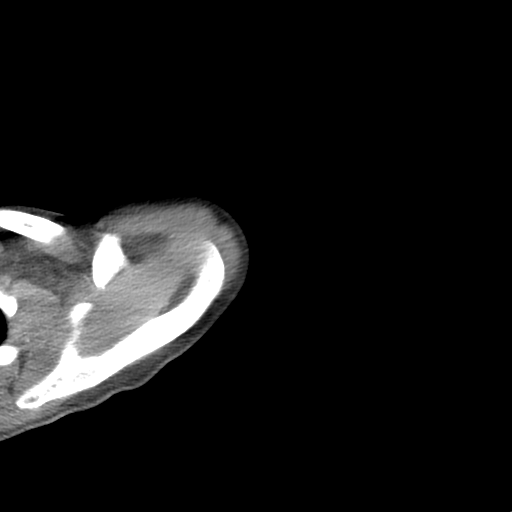

[Series 8: coronals · coronal · 0.42mm/px · 3 of 79 slices shown]
[im 20/79  soft-tissue]
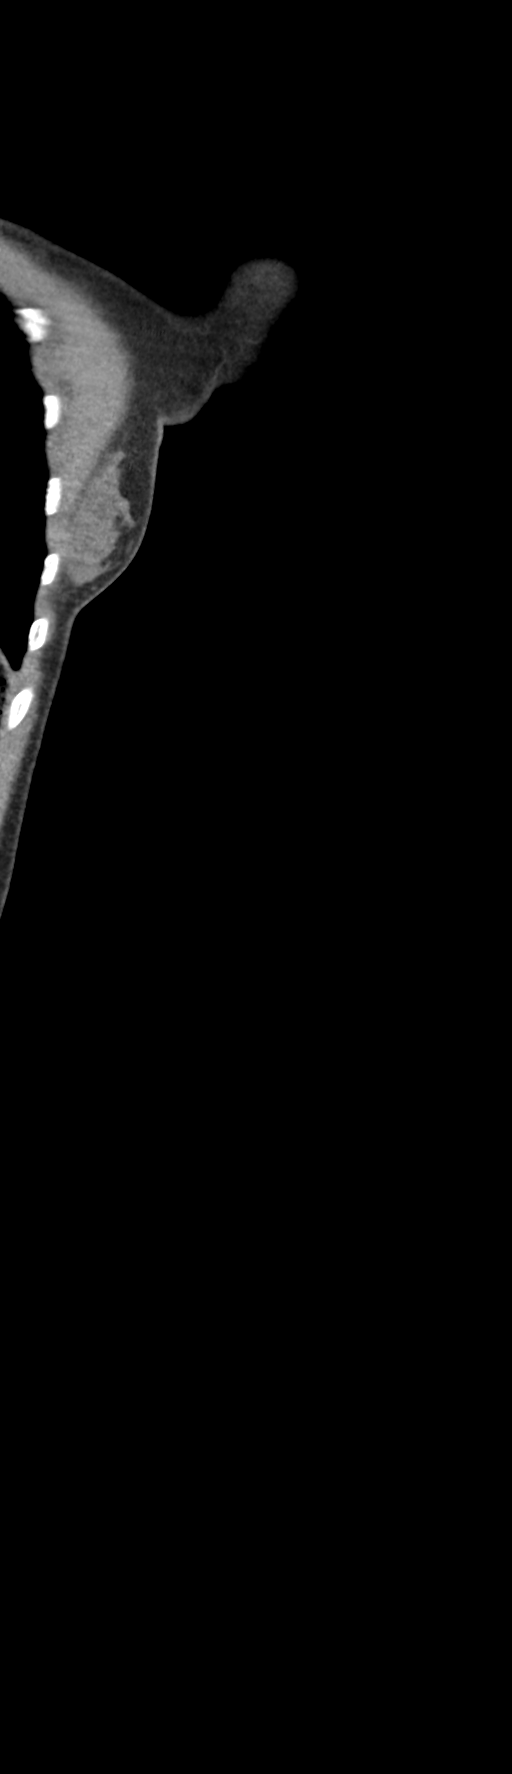
[im 40/79  soft-tissue]
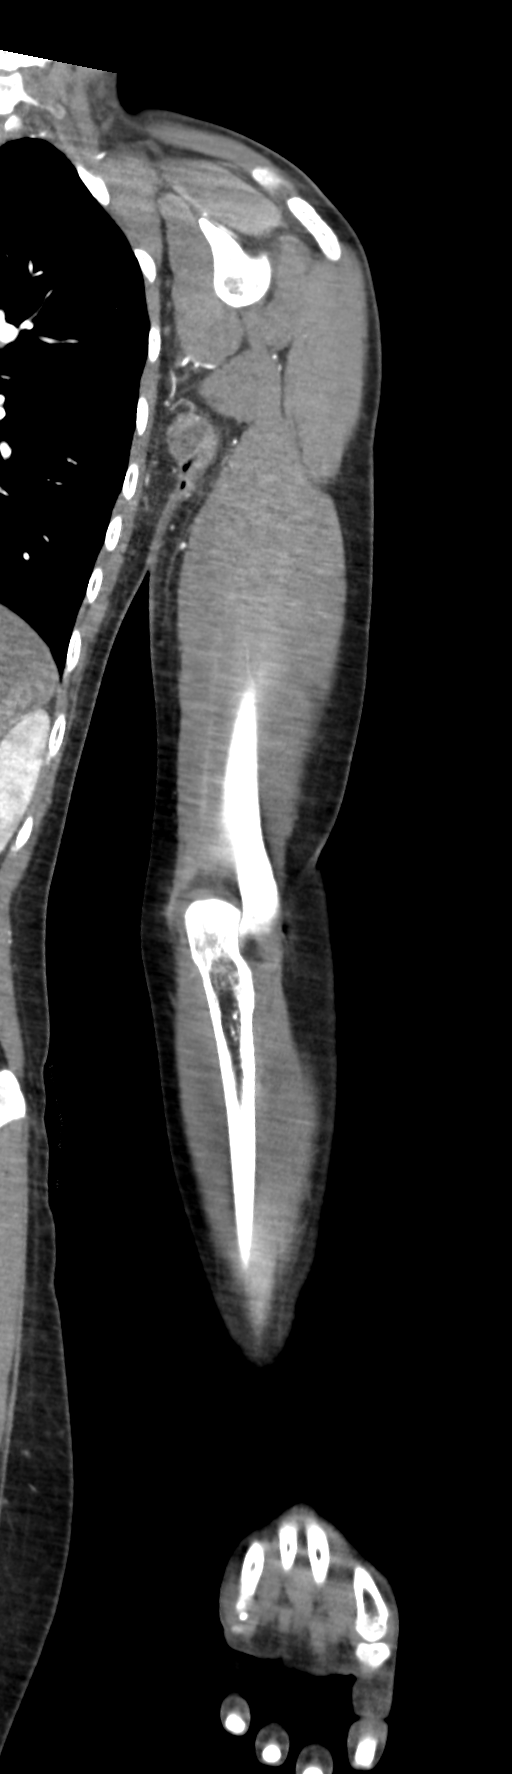
[im 59/79  soft-tissue]
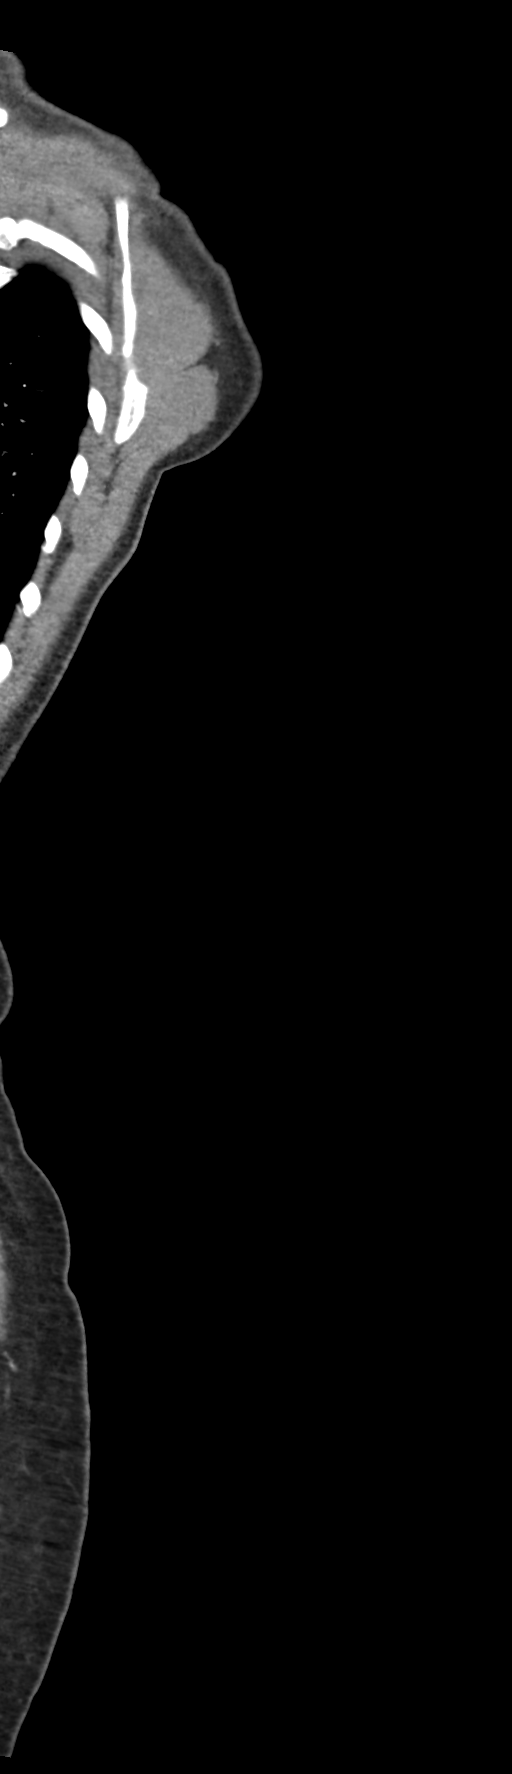

[11 of 46 positions shown; findings below may reference images not displayed]

FINDINGS: Vascular: The left axillary and visualized portions of the distal
left subclavian artery are within normal limits. Left brachial
artery is unremarkable. Normal bifurcation is seen. Radial and ulnar
arteries as well as the interosseous artery are noted to the level
of the wrist.

Nonvascular: In the left axilla, there are multiple peripherally
enhancing fluid collections identified consistent with the given
clinical history these range in size from 1.4 to 2.2 cm. No air is
noted within. Adjacent skin thickening is noted consistent with
localized inflammatory change. These are removed from the artery and
venous structures. No significant lymphadenopathy is identified. The
remainder of the visualized soft tissues appear within normal
limits.

The bony structures are unremarkable.  No erosive changes are seen.

Review of the MIP images confirms the above findings.
IMPRESSION: No vascular abnormality is identified.

Four peripherally enhancing fluid collections identified in the left
axilla which correspond

With the given clinical history and represent abscesses. Mild
associated skin thickening is noted. No other focal abnormality is
noted.
